# Patient Record
Sex: Male | Born: 1970 | Race: White | Hispanic: No | Marital: Married | Smoking: Never smoker
Health system: Southern US, Community
[De-identification: ages and names within clinical notes are randomized; demographics above are authoritative.]

## PROBLEM LIST (undated history)

## (undated) DIAGNOSIS — I10 Essential (primary) hypertension: Secondary | ICD-10-CM

## (undated) HISTORY — PX: NO PAST SURGERIES: SHX2092

## (undated) HISTORY — DX: Essential (primary) hypertension: I10

---

## 2003-01-22 ENCOUNTER — Emergency Department (HOSPITAL_COMMUNITY): Admission: EM | Admit: 2003-01-22 | Discharge: 2003-01-22 | Payer: Self-pay | Admitting: Emergency Medicine

## 2018-01-22 ENCOUNTER — Emergency Department (HOSPITAL_COMMUNITY): Payer: BLUE CROSS/BLUE SHIELD

## 2018-01-22 ENCOUNTER — Encounter (HOSPITAL_COMMUNITY): Payer: Self-pay | Admitting: Emergency Medicine

## 2018-01-22 ENCOUNTER — Emergency Department (HOSPITAL_COMMUNITY)
Admission: EM | Admit: 2018-01-22 | Discharge: 2018-01-22 | Disposition: A | Discharge status: Home / Self Care | Payer: BLUE CROSS/BLUE SHIELD | Attending: Emergency Medicine | Admitting: Emergency Medicine

## 2018-01-22 ENCOUNTER — Other Ambulatory Visit: Payer: Self-pay

## 2018-01-22 DIAGNOSIS — N200 Calculus of kidney: Secondary | ICD-10-CM

## 2018-01-22 DIAGNOSIS — N132 Hydronephrosis with renal and ureteral calculous obstruction: Secondary | ICD-10-CM | POA: Diagnosis not present

## 2018-01-22 DIAGNOSIS — R7989 Other specified abnormal findings of blood chemistry: Secondary | ICD-10-CM | POA: Insufficient documentation

## 2018-01-22 DIAGNOSIS — R739 Hyperglycemia, unspecified: Secondary | ICD-10-CM

## 2018-01-22 DIAGNOSIS — N201 Calculus of ureter: Secondary | ICD-10-CM

## 2018-01-22 DIAGNOSIS — R030 Elevated blood-pressure reading, without diagnosis of hypertension: Secondary | ICD-10-CM | POA: Diagnosis not present

## 2018-01-22 DIAGNOSIS — R109 Unspecified abdominal pain: Secondary | ICD-10-CM | POA: Diagnosis present

## 2018-01-22 LAB — URINALYSIS, ROUTINE W REFLEX MICROSCOPIC
Bilirubin Urine: NEGATIVE
GLUCOSE, UA: NEGATIVE mg/dL
Ketones, ur: NEGATIVE mg/dL
Leukocytes, UA: NEGATIVE
NITRITE: NEGATIVE
PROTEIN: NEGATIVE mg/dL
Specific Gravity, Urine: 1.013 (ref 1.005–1.030)
pH: 7 (ref 5.0–8.0)

## 2018-01-22 LAB — COMPREHENSIVE METABOLIC PANEL
ALT: 40 U/L (ref 0–44)
ANION GAP: 8 (ref 5–15)
AST: 30 U/L (ref 15–41)
Albumin: 3.9 g/dL (ref 3.5–5.0)
Alkaline Phosphatase: 65 U/L (ref 38–126)
BILIRUBIN TOTAL: 0.8 mg/dL (ref 0.3–1.2)
BUN: 20 mg/dL (ref 6–20)
CO2: 27 mmol/L (ref 22–32)
Calcium: 10.1 mg/dL (ref 8.9–10.3)
Chloride: 106 mmol/L (ref 98–111)
Creatinine, Ser: 1.28 mg/dL — ABNORMAL HIGH (ref 0.61–1.24)
GFR calc Af Amer: >60 mL/min (ref >60)
Glucose, Bld: 132 mg/dL — ABNORMAL HIGH (ref 70–99)
POTASSIUM: 3.8 mmol/L (ref 3.5–5.1)
Sodium: 141 mmol/L (ref 135–145)
TOTAL PROTEIN: 6.8 g/dL (ref 6.5–8.1)

## 2018-01-22 LAB — CBC
HEMATOCRIT: 47.2 % (ref 39.0–52.0)
HEMOGLOBIN: 15.5 g/dL (ref 13.0–17.0)
MCH: 30.1 pg (ref 26.0–34.0)
MCHC: 32.8 g/dL (ref 30.0–36.0)
MCV: 91.7 fL (ref 78.0–100.0)
Platelets: 265 10*3/uL (ref 150–400)
RBC: 5.15 MIL/uL (ref 4.22–5.81)
RDW: 12.8 % (ref 11.5–15.5)
WBC: 8.1 10*3/uL (ref 4.0–10.5)

## 2018-01-22 LAB — LIPASE, BLOOD: Lipase: 23 U/L (ref 11–51)

## 2018-01-22 MED ORDER — NAPROXEN 375 MG PO TABS: 375.0000 mg | ORAL_TABLET | Freq: Two times a day (BID) | ORAL | 0 refills | Status: DC

## 2018-01-22 MED ORDER — ONDANSETRON HCL 4 MG PO TABS: 4.0000 mg | ORAL_TABLET | Freq: Three times a day (TID) | ORAL | 0 refills | Status: DC | PRN

## 2018-01-22 MED ORDER — ONDANSETRON HCL 4 MG/2ML IJ SOLN
4.0000 mg | Freq: Once | INTRAMUSCULAR | Status: AC
  Administered 2018-01-22: 4 mg via INTRAVENOUS
  Filled 2018-01-22: qty 2

## 2018-01-22 MED ORDER — TAMSULOSIN HCL 0.4 MG PO CAPS: 0.4000 mg | ORAL_CAPSULE | Freq: Every day | ORAL | 0 refills | Status: DC

## 2018-01-22 MED ORDER — OXYCODONE-ACETAMINOPHEN 5-325 MG PO TABS: 1.0000 | ORAL_TABLET | Freq: Four times a day (QID) | ORAL | 0 refills | Status: DC | PRN

## 2018-01-22 MED ORDER — HYDROMORPHONE HCL 1 MG/ML IJ SOLN
0.5000 mg | Freq: Once | INTRAMUSCULAR | Status: AC
  Administered 2018-01-22: 0.5 mg via INTRAVENOUS
  Filled 2018-01-22: qty 1

## 2018-01-22 NOTE — ED Notes (Signed)
Patient transported to CT 

## 2018-01-22 NOTE — ED Triage Notes (Signed)
Pt reports rt sided flank pain that radiates to the RLQ that has been intermittent for the last 4 days. Pt reports nausea, denies V/D. Pt reports taking an antacid with some relief.

## 2018-01-22 NOTE — ED Provider Notes (Addendum)
[facility] EMERGENCY DEPARTMENT Provider Note   CSN: [id number] Arrival date & time: 01/22/18  4098     History   Chief Complaint Chief Complaint  Patient presents with  . Flank Pain  . Nausea    HPI Seth Underwood is a 47 y.o. male.  Presents emergency department chief complaint of flank pain.  Patient had sudden onset of very sharp severe right flank pain radiating into his abdomen about 3 days ago.  He has had several episodes of intermittent and severe pain with associated nausea without vomiting.  He states that yesterday they were going to come to the urgent care but by the time he arrived it resolved however when he returned home it became severe again.  His wife is at the bedside and states that he is unable to sit still or find a position of comfort and is squirming all over the place when it hits.  He has no previous history of kidney stones.  The pain does not radiate into his hips or legs.  He denies urinary symptoms such as frequency urgency hematuria or dysuria.  He denies fevers or chills.  He has no known contributing past medical history . HPI  History reviewed. No pertinent past medical history.  There are no active problems to display for this patient.   History reviewed. No pertinent surgical history.      Home Medications    Prior to Admission medications   Not on File    Family History No family history on file.  Social History Social History   Tobacco Use  . Smoking status: Never Smoker  . Smokeless tobacco: Never Used  Substance Use Topics  . Alcohol use: Yes    Comment: occ  . Drug use: Never     Allergies   Ceclor [cefaclor]   Review of Systems Review of Systems   Ten systems reviewed and are negative for acute change, except as noted in the HPI.    Physical Exam Updated Vital Signs BP (!) 135/95   Pulse 61   Temp 97.9 F (36.6 C) (Oral)   Resp 16   Ht 5\' 9"  (1.753 m)   Wt 97.5 kg   SpO2 100%   BMI  31.75 kg/m   Physical Exam  Constitutional: He appears well-developed and well-nourished. No distress.  Appears uncomfortable  HENT:  Head: Normocephalic and atraumatic.  Eyes: Conjunctivae are normal. No scleral icterus.  Neck: Normal range of motion. Neck supple.  Cardiovascular: Normal rate, regular rhythm and normal heart sounds.  Pulmonary/Chest: Effort normal and breath sounds normal. No respiratory distress.  Abdominal: Soft. He exhibits no distension and no mass. There is no tenderness. There is no guarding.  R cva tenderness  Musculoskeletal: He exhibits no edema.  Neurological: He is alert.  Skin: Skin is warm and dry. He is not diaphoretic.  Psychiatric: His behavior is normal.  Nursing note and vitals reviewed.    ED Treatments / Results  Labs (all labs ordered are listed, but only abnormal results are displayed) Labs Reviewed  COMPREHENSIVE METABOLIC PANEL - Abnormal; Notable for the following components:      Result Value   Glucose, Bld 132 (*)    Creatinine, Ser 1.28 (*)    All other components within normal limits  URINALYSIS, ROUTINE W REFLEX MICROSCOPIC - Abnormal; Notable for the following components:   APPearance CLOUDY (*)    Hgb urine dipstick SMALL (*)    Bacteria, UA RARE (*)  All other components within normal limits  LIPASE, BLOOD  CBC    EKG None  Radiology Ct Renal Stone Study  Result Date: 01/22/2018 CLINICAL DATA:  Right flank pain for 4 days. Nausea. EXAM: CT ABDOMEN AND PELVIS WITHOUT CONTRAST TECHNIQUE: Multidetector CT imaging of the abdomen and pelvis was performed following the standard protocol without IV contrast. COMPARISON:  None. FINDINGS: Lower chest: The visualized lung bases are clear. Hepatobiliary: Hepatic steatosis. No evidence gallstones, gallbladder wall thickening, or biliary dilatation. Pancreas: Unremarkable. Spleen: Unremarkable. Adrenals/Urinary Tract: Unremarkable adrenal glands. There are 4 right renal calculi  with the largest measuring 4 mm. There is a 3 mm calculus in the proximal right ureter resulting in mild right hydronephrosis. There is only minimal dilatation of the proximal right ureter with mild periureteral stranding noted. No left renal calculi are identified. Small rounded hypodense structures in the left renal hilum may represent parapelvic cysts. The bladder is unremarkable. Stomach/Bowel: The stomach is within normal limits. There is no evidence of bowel obstruction. Mild sigmoid colon diverticulosis is noted without evidence of diverticulitis. The appendix is unremarkable. Vascular/Lymphatic: Normal caliber of the abdominal aorta. No enlarged lymph nodes. Reproductive: Unremarkable prostate. Other: No intraperitoneal free fluid. No abdominal wall hernia. Musculoskeletal: No acute osseous abnormality or suspicious osseous lesion. Mild lower lumbar and thoracic disc degeneration. IMPRESSION: 1. Mild right hydronephrosis secondary to a 3 mm proximal right ureteral stone. 2. Additional small right renal calculi. 3. Hepatic steatosis. Electronically Signed   By: Sebastian Ache M.D.   On: 01/22/2018 08:53    Procedures Procedures (including critical care time)  Medications Ordered in ED Medications  ondansetron (ZOFRAN) injection 4 mg (4 mg Intravenous Given 01/22/18 0807)  HYDROmorphone (DILAUDID) injection 0.5 mg (0.5 mg Intravenous Given 01/22/18 0808)     Initial Impression / Assessment and Plan / ED Course  I have reviewed the triage vital signs and the nursing notes.  Pertinent labs & imaging results that were available during my care of the patient were reviewed by me and considered in my medical decision making (see chart for details).  Clinical Course as of Jan 23 919  Thu Jan 22, 2018  0919 Slight elevation of Creatinine.  Creatinine(!): 1.28 [AH]  0919 Patient CT scan shows right proximal ureteral calculus 3 mm.   [AH]    Clinical Course User Index [AH] Arthor Captain, PA-C     Pt has been diagnosed with a Kidney Stone via CT. There is no evidence of significant hydronephrosis, serum creatine slightly elevated, vitals sign stable and the pt does not have irratractable vomiting. Pt will be dc home with pain medications & has been advised to follow up with PCP.    Final Clinical Impressions(s) / ED Diagnoses   Final diagnoses:  Right ureteral stone  Elevated blood pressure reading  Elevated blood sugar level  Kidney stone  Elevated serum creatinine    ED Discharge Orders    None       Arthor Captain, PA-C 01/22/18 1647    Arthor Captain, PA-C 01/22/18 1648    Little, Ambrose [place], MD 01/23/18 1530

## 2018-01-22 NOTE — Discharge Instructions (Signed)
Return to the ED immediately if you develop fever, uncontrolled pain or vomiting, or other concerns. ° °

## 2018-01-22 NOTE — ED Notes (Signed)
Pt is complaining of right flank pain. States that pain comes and goes, with increased pain pt has nausea. Pt states that the pain originally started 3 days ago.

## 2019-03-25 IMAGING — CT CT RENAL STONE PROTOCOL
2 of 5 series · 16 of 46 positions shown, 18 images · non-contrast
Comparison: None.

CLINICAL DATA: Right flank pain for 4 days. Nausea.

EXAM:
CT ABDOMEN AND PELVIS WITHOUT CONTRAST
TECHNIQUE: Multidetector CT imaging of the abdomen and pelvis was performed
following the standard protocol without IV contrast.

[Series 3: renal stone 5.0 · axial · 0.90mm/px · z∈[+812,+1236]mm · 13 of 95 slices shown, 15 images]
[im 5/95  soft-tissue]
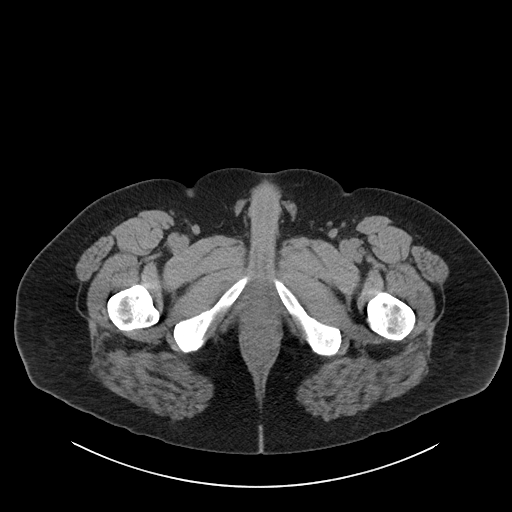
[im 5/95  bone]
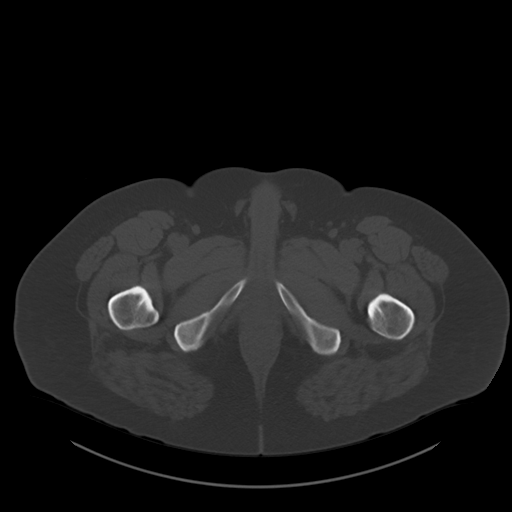
[im 14/95  soft-tissue]
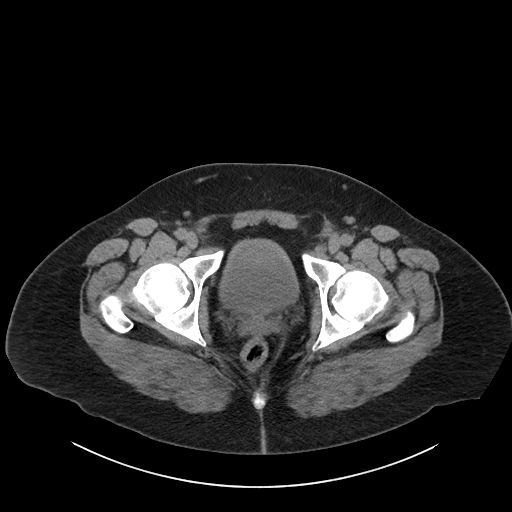
[im 18/95  soft-tissue]
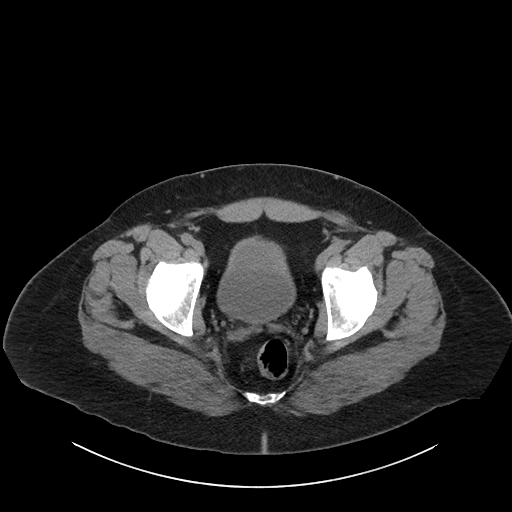
[im 27/95  soft-tissue]
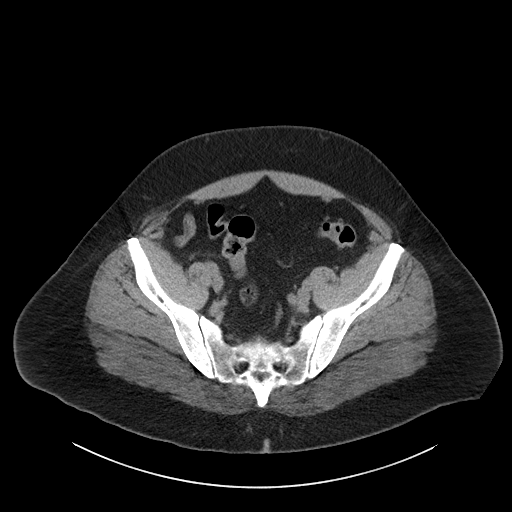
[im 32/95  soft-tissue]
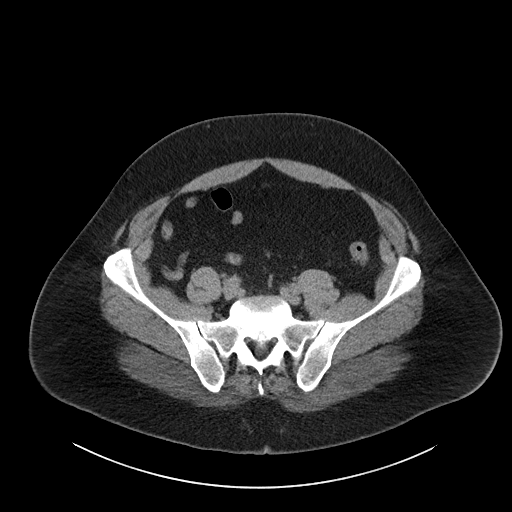
[im 41/95  soft-tissue]
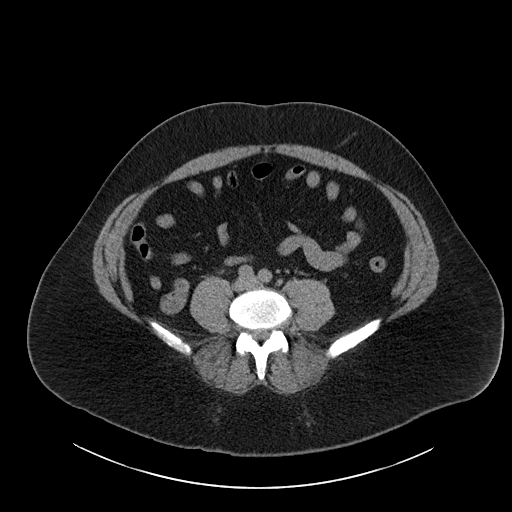
[im 50/95  soft-tissue]
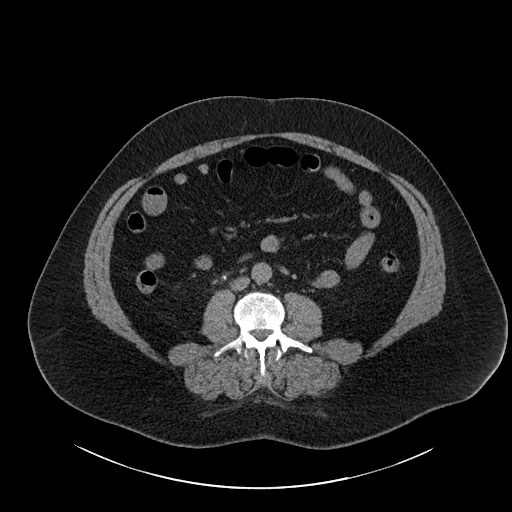
[im 54/95  soft-tissue]
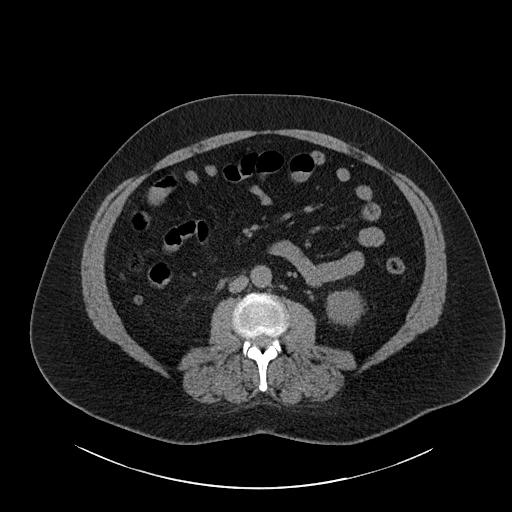
[im 63/95  soft-tissue]
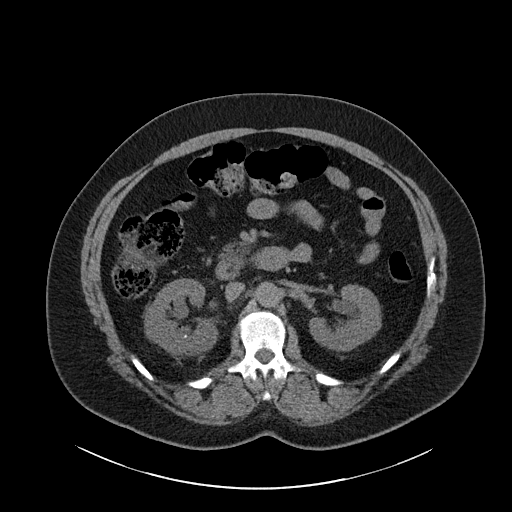
[im 63/95  bone]
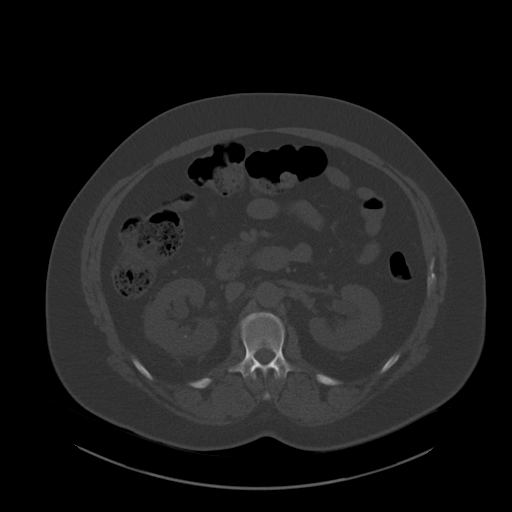
[im 68/95  soft-tissue]
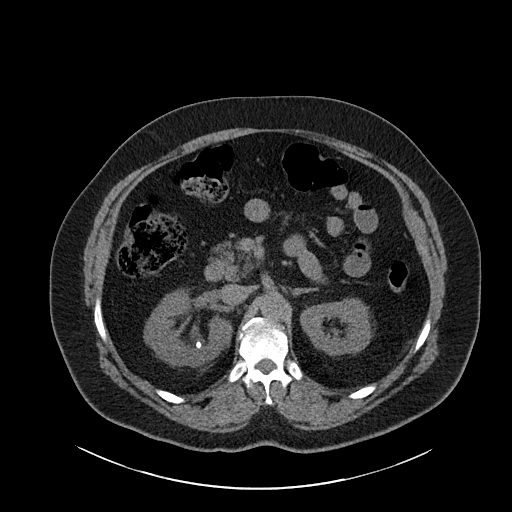
[im 77/95  soft-tissue]
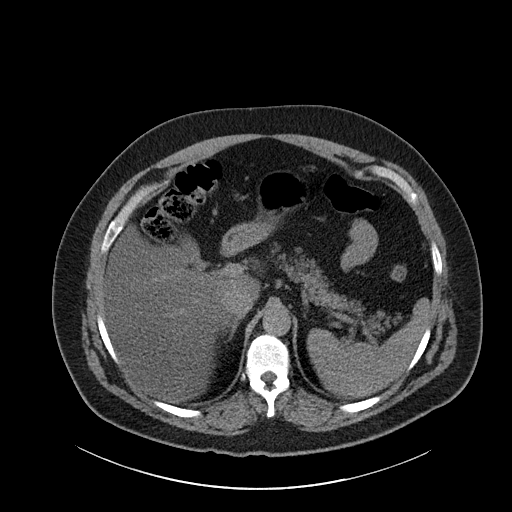
[im 81/95  soft-tissue]
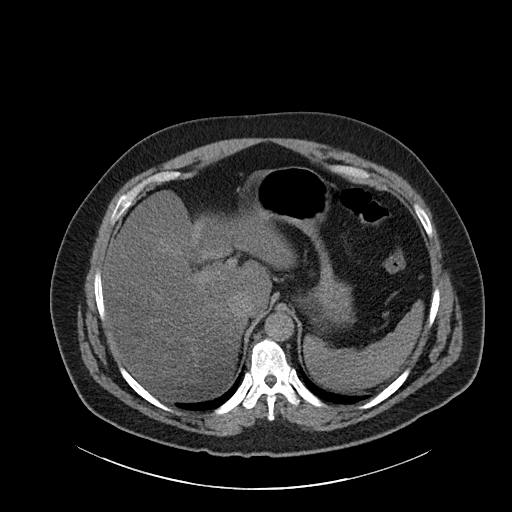
[im 90/95  soft-tissue]
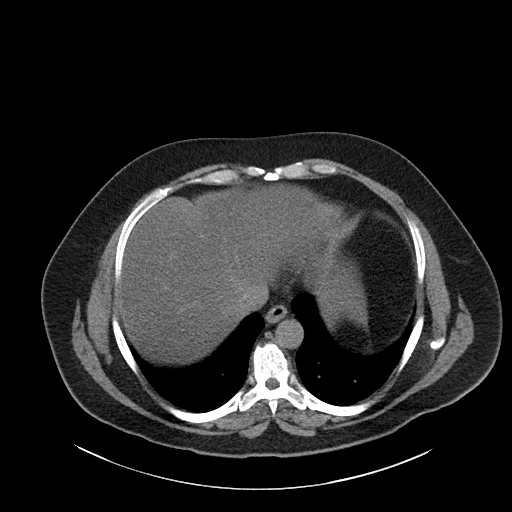

[Series 5: renal stone 3.0 cor · coronal · 0.82mm/px · 3 of 101 slices shown]
[im 34/101  soft-tissue]
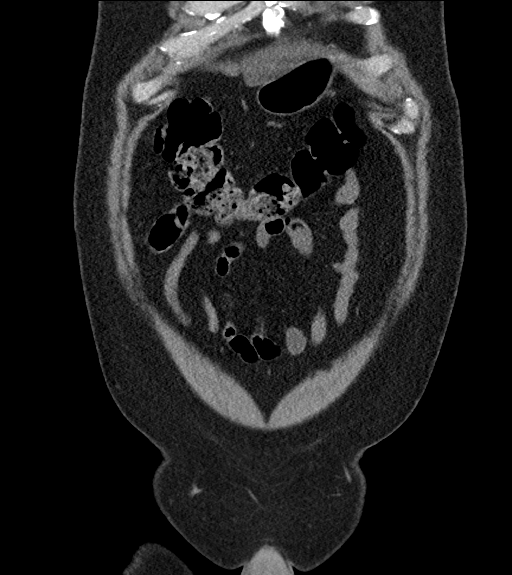
[im 45/101  soft-tissue]
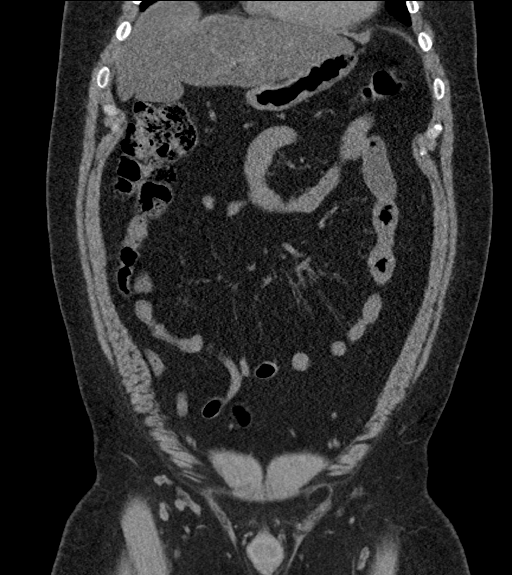
[im 56/101  soft-tissue]
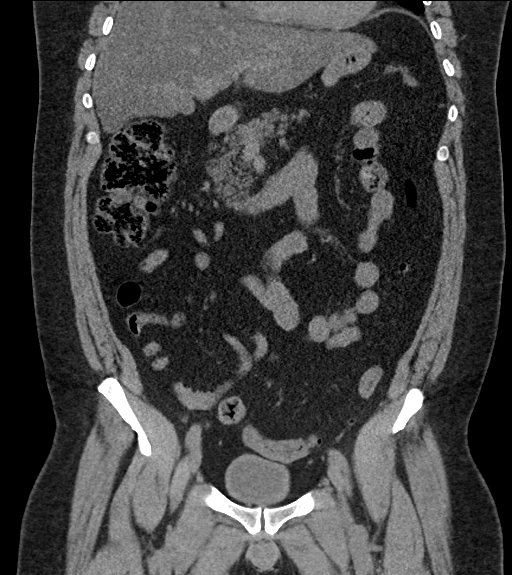

[16 of 46 positions shown; findings below may reference images not displayed]

FINDINGS: Lower chest: The visualized lung bases are clear.

Hepatobiliary: Hepatic steatosis. No evidence gallstones,
gallbladder wall thickening, or biliary dilatation.

Pancreas: Unremarkable.

Spleen: Unremarkable.

Adrenals/Urinary Tract: Unremarkable adrenal glands. There are 4
right renal calculi with the largest measuring 4 mm. There is a 3 mm
calculus in the proximal right ureter resulting in mild right
hydronephrosis. There is only minimal dilatation of the proximal
right ureter with mild periureteral stranding noted. No left renal
calculi are identified. Small rounded hypodense structures in the
left renal hilum may represent parapelvic cysts. The bladder is
unremarkable.

Stomach/Bowel: The stomach is within normal limits. There is no
evidence of bowel obstruction. Mild sigmoid colon diverticulosis is
noted without evidence of diverticulitis. The appendix is
unremarkable.

Vascular/Lymphatic: Normal caliber of the abdominal aorta. No
enlarged lymph nodes.

Reproductive: Unremarkable prostate.

Other: No intraperitoneal free fluid. No abdominal wall hernia.

Musculoskeletal: No acute osseous abnormality or suspicious osseous
lesion. Mild lower lumbar and thoracic disc degeneration.
IMPRESSION: 1. Mild right hydronephrosis secondary to a 3 mm proximal right
ureteral stone.
2. Additional small right renal calculi.
3. Hepatic steatosis.

## 2019-11-04 ENCOUNTER — Ambulatory Visit: Payer: BLUE CROSS/BLUE SHIELD | Admitting: Family Medicine

## 2020-05-22 ENCOUNTER — Ambulatory Visit (INDEPENDENT_AMBULATORY_CARE_PROVIDER_SITE_OTHER): Payer: 59 | Admitting: Family Medicine

## 2020-05-22 ENCOUNTER — Encounter: Payer: Self-pay | Admitting: Family Medicine

## 2020-05-22 ENCOUNTER — Other Ambulatory Visit: Payer: Self-pay

## 2020-05-22 VITALS — BP 130/90 | HR 105 | Ht 67.0 in | Wt 211.8 lb

## 2020-05-22 DIAGNOSIS — E1169 Type 2 diabetes mellitus with other specified complication: Secondary | ICD-10-CM | POA: Insufficient documentation

## 2020-05-22 DIAGNOSIS — R531 Weakness: Secondary | ICD-10-CM

## 2020-05-22 DIAGNOSIS — R350 Frequency of micturition: Secondary | ICD-10-CM | POA: Diagnosis not present

## 2020-05-22 DIAGNOSIS — R634 Abnormal weight loss: Secondary | ICD-10-CM

## 2020-05-22 DIAGNOSIS — E669 Obesity, unspecified: Secondary | ICD-10-CM | POA: Insufficient documentation

## 2020-05-22 DIAGNOSIS — E119 Type 2 diabetes mellitus without complications: Secondary | ICD-10-CM

## 2020-05-22 DIAGNOSIS — R631 Polydipsia: Secondary | ICD-10-CM | POA: Diagnosis not present

## 2020-05-22 DIAGNOSIS — R81 Glycosuria: Secondary | ICD-10-CM | POA: Diagnosis not present

## 2020-05-22 DIAGNOSIS — R824 Acetonuria: Secondary | ICD-10-CM

## 2020-05-22 LAB — CBC WITH DIFFERENTIAL/PLATELET
Basophils Absolute: 0.1 10*3/uL (ref 0.0–0.2)
Basos: 1 %
Eos: 3 %
Immature Grans (Abs): 0 10*3/uL (ref 0.0–0.1)
Lymphocytes Absolute: 2.9 10*3/uL (ref 0.7–3.1)
Neutrophils Absolute: 3.9 10*3/uL (ref 1.4–7.0)
Platelets: 263 10*3/uL (ref 150–450)
RDW: 12.4 % (ref 11.6–15.4)

## 2020-05-22 LAB — POCT URINALYSIS DIP (PROADVANTAGE DEVICE)
Bilirubin, UA: NEGATIVE
Blood, UA: NEGATIVE
Glucose, UA: >=1000 mg/dL — AB
Leukocytes, UA: NEGATIVE
Nitrite, UA: NEGATIVE
Protein Ur, POC: NEGATIVE mg/dL
Specific Gravity, Urine: 1.025
Urobilinogen, Ur: NEGATIVE
pH, UA: 5.5 (ref 5.0–8.0)

## 2020-05-22 LAB — COMPREHENSIVE METABOLIC PANEL

## 2020-05-22 LAB — POCT GLYCOSYLATED HEMOGLOBIN (HGB A1C): Hemoglobin A1C: 14 % — AB (ref 4.0–5.6)

## 2020-05-22 LAB — LIPID PANEL

## 2020-05-22 LAB — GLUCOSE, POCT (MANUAL RESULT ENTRY): POC Glucose: 308 mg/dl — AB (ref 70–99)

## 2020-05-22 MED ORDER — ONETOUCH VERIO W/DEVICE KIT: PACK | 0 refills | Status: DC

## 2020-05-22 MED ORDER — DAPAGLIFLOZIN PROPANEDIOL 5 MG PO TABS: 5.0000 mg | ORAL_TABLET | Freq: Every day | ORAL | 2 refills | Status: DC

## 2020-05-22 MED ORDER — METFORMIN HCL 1000 MG PO TABS: 1000.0000 mg | ORAL_TABLET | Freq: Two times a day (BID) | ORAL | 1 refills | Status: DC

## 2020-05-22 MED ORDER — ATORVASTATIN CALCIUM 10 MG PO TABS: 10.0000 mg | ORAL_TABLET | Freq: Every day | ORAL | 1 refills | Status: DC

## 2020-05-22 MED ORDER — LANTUS SOLOSTAR 100 UNIT/ML ~~LOC~~ SOPN: 10.0000 [IU] | PEN_INJECTOR | Freq: Every day | SUBCUTANEOUS | 2 refills | Status: DC

## 2020-05-22 MED ORDER — GLUCOSE BLOOD VI STRP: ORAL_STRIP | 1 refills | Status: DC

## 2020-05-22 MED ORDER — ONETOUCH DELICA LANCETS 33G MISC: 1 refills | Status: DC

## 2020-05-22 NOTE — Patient Instructions (Addendum)
Your hemoglobin A1c is greater than 14% today and you have diabetes that is currently uncontrolled.  Is important that you continue eating a low-carb diet.  Try to limit carbohydrates to 45-60 per meal and around 15 per snack. Try to walk 10 minutes at a time 2-3 times per day.  Start checking your blood sugars as discussed.  Fasting goal blood sugar is 80-130.  2 hours after a meal the goal is 130-160.  It will take some time to get your blood sugars down.  We do not want to drop them very fast.  I am starting you on to oral medications today.  Metformin and Farxiga. I am also starting you on a daily long-acting insulin.  This should be 10 units/day.  You should receive a call from the diabetes nutritionist.   Call and schedule your diabetes eye exam.   Follow up with me in 1 week.     Diabetes Mellitus and Nutrition, Adult When you have diabetes, or diabetes mellitus, it is very important to have healthy eating habits because your blood sugar (glucose) levels are greatly affected by what you eat and drink. Eating healthy foods in the right amounts, at about the same times every day, can help you:  Control your blood glucose.  Lower your risk of heart disease.  Improve your blood pressure.  Reach or maintain a healthy weight. What can affect my meal plan? Every person with diabetes is different, and each person has different needs for a meal plan. Your health care provider may recommend that you work with a dietitian to make a meal plan that is best for you. Your meal plan may vary depending on factors such as:  The calories you need.  The medicines you take.  Your weight.  Your blood glucose, blood pressure, and cholesterol levels.  Your activity level.  Other health conditions you have, such as heart or kidney disease. How do carbohydrates affect me? Carbohydrates, also called carbs, affect your blood glucose level more than any other type of food. Eating carbs  naturally raises the amount of glucose in your blood. Carb counting is a method for keeping track of how many carbs you eat. Counting carbs is important to keep your blood glucose at a healthy level, especially if you use insulin or take certain oral diabetes medicines. It is important to know how many carbs you can safely have in each meal. This is different for every person. Your dietitian can help you calculate how many carbs you should have at each meal and for each snack. How does alcohol affect me? Alcohol can cause a sudden decrease in blood glucose (hypoglycemia), especially if you use insulin or take certain oral diabetes medicines. Hypoglycemia can be a life-threatening condition. Symptoms of hypoglycemia, such as sleepiness, dizziness, and confusion, are similar to symptoms of having too much alcohol.  Do not drink alcohol if: ? Your health care provider tells you not to drink. ? You are pregnant, may be pregnant, or are planning to become pregnant.  If you drink alcohol: ? Do not drink on an empty stomach. ? Limit how much you use to:  0-1 drink a day for women.  0-2 drinks a day for men. ? Be aware of how much alcohol is in your drink. In the [place] one drink equals one 12 oz bottle of beer (355 mL), one 5 oz glass of wine (148 mL), or one 1 oz glass of hard liquor (44 mL). ? Keep yourself  hydrated with water, diet soda, or unsweetened iced tea.  Keep in mind that regular soda, juice, and other mixers may contain a lot of sugar and must be counted as carbs. What are tips for following this plan? Reading food labels  Start by checking the serving size on the "Nutrition Facts" label of packaged foods and drinks. The amount of calories, carbs, fats, and other nutrients listed on the label is based on one serving of the item. Many items contain more than one serving per package.  Check the total grams (g) of carbs in one serving. You can calculate the number of servings of carbs in  one serving by dividing the total carbs by 15. For example, if a food has 30 g of total carbs per serving, it would be equal to 2 servings of carbs.  Check the number of grams (g) of saturated fats and trans fats in one serving. Choose foods that have a low amount or none of these fats.  Check the number of milligrams (mg) of salt (sodium) in one serving. Most people should limit total sodium intake to less than 2,300 mg per day.  Always check the nutrition information of foods labeled as "low-fat" or "nonfat." These foods may be higher in added sugar or refined carbs and should be avoided.  Talk to your dietitian to identify your daily goals for nutrients listed on the label. Shopping  Avoid buying canned, pre-made, or processed foods. These foods tend to be high in fat, sodium, and added sugar.  Shop around the outside edge of the grocery store. This is where you will most often find fresh fruits and vegetables, bulk grains, fresh meats, and fresh dairy. Cooking  Use low-heat cooking methods, such as baking, instead of high-heat cooking methods like deep frying.  Cook using healthy oils, such as olive, canola, or sunflower oil.  Avoid cooking with butter, cream, or high-fat meats. Meal planning  Eat meals and snacks regularly, preferably at the same times every day. Avoid going long periods of time without eating.  Eat foods that are high in fiber, such as fresh fruits, vegetables, beans, and whole grains. Talk with your dietitian about how many servings of carbs you can eat at each meal.  Eat 4-6 oz (112-168 g) of lean protein each day, such as lean meat, chicken, fish, eggs, or tofu. One ounce (oz) of lean protein is equal to: ? 1 oz (28 g) of meat, chicken, or fish. ? 1 egg. ?  cup (62 g) of tofu.  Eat some foods each day that contain healthy fats, such as avocado, nuts, seeds, and fish.   What foods should I eat? Fruits Berries. Apples. Oranges. Peaches. Apricots. Plums.  Grapes. Mango. Papaya. Pomegranate. Kiwi. Cherries. Vegetables Lettuce. Spinach. Leafy greens, including kale, chard, collard greens, and mustard greens. Beets. Cauliflower. Cabbage. Broccoli. Carrots. Green beans. Tomatoes. Peppers. Onions. Cucumbers. Brussels sprouts. Grains Whole grains, such as whole-wheat or whole-grain bread, crackers, tortillas, cereal, and pasta. Unsweetened oatmeal. Quinoa. Brown or wild rice. Meats and other proteins Seafood. Poultry without skin. Lean cuts of poultry and beef. Tofu. Nuts. Seeds. Dairy Low-fat or fat-free dairy products such as milk, yogurt, and cheese. The items listed above may not be a complete list of foods and beverages you can eat. Contact a dietitian for more information. What foods should I avoid? Fruits Fruits canned with syrup. Vegetables Canned vegetables. Frozen vegetables with butter or cream sauce. Grains Refined white flour and flour products such as  bread, pasta, snack foods, and cereals. Avoid all processed foods. Meats and other proteins Fatty cuts of meat. Poultry with skin. Breaded or fried meats. Processed meat. Avoid saturated fats. Dairy Full-fat yogurt, cheese, or milk. Beverages Sweetened drinks, such as soda or iced tea. The items listed above may not be a complete list of foods and beverages you should avoid. Contact a dietitian for more information. Questions to ask a health care provider  Do I need to meet with a diabetes educator?  Do I need to meet with a dietitian?  What number can I call if I have questions?  When are the best times to check my blood glucose? Where to find more information:  American Diabetes Association: diabetes.org  Academy of Nutrition and Dietetics: www.eatright.[employer] of Diabetes and Digestive and Kidney Diseases: CarFlippers.tn  Association of Diabetes Care and Education Specialists: www.diabeteseducator.org Summary  It is important to have healthy  eating habits because your blood sugar (glucose) levels are greatly affected by what you eat and drink.  A healthy meal plan will help you control your blood glucose and maintain a healthy lifestyle.  Your health care provider may recommend that you work with a dietitian to make a meal plan that is best for you.  Keep in mind that carbohydrates (carbs) and alcohol have immediate effects on your blood glucose levels. It is important to count carbs and to use alcohol carefully. This information is not intended to replace advice given to you by your health care provider. Make sure you discuss any questions you have with your health care provider. Document Revised: 03/16/2019 Document Reviewed: 03/16/2019 Elsevier Patient Education  2021 [employer].

## 2020-05-22 NOTE — Progress Notes (Signed)
Subjective:    Patient ID: Seth Underwood, male    DOB: 07/16/1970, 50 y.o.   MRN: [id number]  HPI Chief Complaint  Patient presents with  . Urinary Frequency    Urinary frequency for the last several weeks. Up every hour, thirsty all the time,weakness, no history of diabetes,    He is new to the practice and here to establish care.  His wife is with him today. No regular medical care in 13 years or so.   Complains of one week history of dry mouth, urinary frequency, polydipsia, polyuria, weakness, and unexplained weight loss.  Denies history of diabetes. States he and his wife thought he may have developed diabetes over the past couple of weeks he has cut back on sugar and carbohydrates.  He stopped soda.  Reports a history of intermittent leg cramps for the past year or so.   Denies fever, chills, body aches, chest pain, palpitations, DOE, orthopnea, abdominal pain, vomiting, diarrhea or LE edema.   Social history: married, 3 kids, he reupholsterers furniture Denies smoking or drug use. Occasional alcohol.    Reviewed allergies, medications, past medical, surgical, family, and social history.    Review of Systems Pertinent positives and negatives in the history of present illness.     Objective:   Physical Exam BP 130/90   Pulse (!) 105   Ht 5\' 7"  (1.702 m)   Wt 211 lb 12.8 oz (96.1 kg)   BMI 33.17 kg/m   Alert and in no distress. Cardiac exam shows a regular sinus rhythm without murmurs or gallops. Lungs are clear to auscultation.  Extremities without edema.  Skin is warm and dry.        Assessment & Plan:  New onset type 2 diabetes mellitus (HCC) - Plan: CBC with Differential/Platelet, Comprehensive metabolic panel, TSH, T4, free, T3, EKG 12-Lead, Lipid panel, metFORMIN (GLUCOPHAGE) 1000 MG tablet, dapagliflozin propanediol (FARXIGA) 5 MG TABS tablet, insulin glargine (LANTUS SOLOSTAR) 100 UNIT/ML Solostar Pen, atorvastatin (LIPITOR) 10 MG tablet, Amb ref to  Medical Nutrition Therapy-MNT  Frequent urination - Plan: HgB A1c, Glucose (CBG), POCT Urinalysis DIP (Proadvantage Device), Microalbumin/Creatinine Ratio, Urine  Always thirsty - Plan: HgB A1c, Glucose (CBG), POCT Urinalysis DIP (Proadvantage Device), Microalbumin/Creatinine Ratio, Urine  Glucose found in urine on examination - Plan: HgB A1c, Glucose (CBG), POCT Urinalysis DIP (Proadvantage Device), Microalbumin/Creatinine Ratio, Urine  Unexplained weight loss - Plan: CBC with Differential/Platelet, Comprehensive metabolic panel, TSH, T4, free, T3  Weakness - Plan: CBC with Differential/Platelet, Comprehensive metabolic panel, TSH, T4, free, T3  Obesity (BMI 30-39.9) - Plan: Amb ref to Medical Nutrition Therapy-MNT  Ketonuria  Hemoglobin A1c greater than 14% EKG shows NSR, rate 98, PR interval 152 ms, QRS duration 86 ms. Poor R wave progression. Some early repolarization per Dr. .  PCO T blood sugar 308.  3 hours postprandial Urinalysis dipstick positive for glucose and ketones He is quite symptomatic.  New onset diabetes.  His wife is with him.  She is very familiar with diabetes In-depth counseling on the diabetes spectrum including long-term health consequences associated with uncontrolled diabetes including heart disease, amputations, blindness, kidney disease.  Discussed standards of care for patients with diabetes including an ACE inhibitor or ARB, statin, and diabetic eye exam.  He will call and schedule an eye exam. I will start him on Metformin, Farxiga and Lantus. He will start checking his blood sugars fasting and 2 hours postprandial, twice daily.  He will keep a record of these  and bring them to his follow-up visit. Referral to MNT for further assistance with diet He appears to be motivated to lower his blood sugars with healthy diet and exercise  I am also starting him on a statin.  I will hold off on an ACE inhibitor or ARB until his follow-up in 1 week.  Check  labs and follow-up

## 2020-05-23 ENCOUNTER — Encounter: Payer: Self-pay | Admitting: Family Medicine

## 2020-05-23 DIAGNOSIS — R7401 Elevation of levels of liver transaminase levels: Secondary | ICD-10-CM

## 2020-05-23 DIAGNOSIS — E785 Hyperlipidemia, unspecified: Secondary | ICD-10-CM

## 2020-05-23 DIAGNOSIS — E1169 Type 2 diabetes mellitus with other specified complication: Secondary | ICD-10-CM

## 2020-05-23 HISTORY — DX: Elevation of levels of liver transaminase levels: R74.01

## 2020-05-23 HISTORY — DX: Type 2 diabetes mellitus with other specified complication: E11.69

## 2020-05-23 HISTORY — DX: Hypercalcemia: E83.52

## 2020-05-23 HISTORY — DX: Hyperlipidemia, unspecified: E78.5

## 2020-05-23 LAB — COMPREHENSIVE METABOLIC PANEL
ALT: 57 IU/L — ABNORMAL HIGH (ref 0–44)
AST: 35 IU/L (ref 0–40)
Albumin/Globulin Ratio: 1.7 (ref 1.2–2.2)
Alkaline Phosphatase: 98 IU/L (ref 44–121)
Bilirubin Total: 0.4 mg/dL (ref 0.0–1.2)
Chloride: 99 mmol/L (ref 96–106)
Creatinine, Ser: 0.86 mg/dL (ref 0.76–1.27)
GFR calc Af Amer: 117 mL/min/{1.73_m2} (ref >59)
Globulin, Total: 2.6 g/dL (ref 1.5–4.5)
Glucose: 307 mg/dL — ABNORMAL HIGH (ref 65–99)
Sodium: 136 mmol/L (ref 134–144)
Total Protein: 7.1 g/dL (ref 6.0–8.5)

## 2020-05-23 LAB — LIPID PANEL
Chol/HDL Ratio: 7.1 ratio — ABNORMAL HIGH (ref 0.0–5.0)
Cholesterol, Total: 291 mg/dL — ABNORMAL HIGH (ref 100–199)
HDL: 41 mg/dL (ref >39)
Triglycerides: 413 mg/dL — ABNORMAL HIGH (ref 0–149)
VLDL Cholesterol Cal: 81 mg/dL — ABNORMAL HIGH (ref 5–40)

## 2020-05-23 LAB — CBC WITH DIFFERENTIAL/PLATELET
EOS (ABSOLUTE): 0.2 10*3/uL (ref 0.0–0.4)
Hematocrit: 53 % — ABNORMAL HIGH (ref 37.5–51.0)
Hemoglobin: 18.1 g/dL — ABNORMAL HIGH (ref 13.0–17.7)
Immature Granulocytes: 0 %
Lymphs: 38 %
MCH: 29.5 pg (ref 26.6–33.0)
MCHC: 34.2 g/dL (ref 31.5–35.7)
MCV: 87 fL (ref 79–97)
Monocytes Absolute: 0.5 10*3/uL (ref 0.1–0.9)
Monocytes: 7 %
Neutrophils: 51 %
RBC: 6.13 x10E6/uL — ABNORMAL HIGH (ref 4.14–5.80)
WBC: 7.6 10*3/uL (ref 3.4–10.8)

## 2020-05-23 LAB — MICROALBUMIN / CREATININE URINE RATIO
Creatinine, Urine: 62 mg/dL
Microalb/Creat Ratio: 18 mg/g creat (ref 0–29)
Microalbumin, Urine: 11 ug/mL

## 2020-05-23 LAB — T3: T3, Total: 102 ng/dL (ref 71–180)

## 2020-05-23 LAB — TSH: TSH: 2.8 u[IU]/mL (ref 0.450–4.500)

## 2020-05-23 LAB — T4, FREE: Free T4: 1.12 ng/dL (ref 0.82–1.77)

## 2020-05-25 ENCOUNTER — Encounter: Payer: Self-pay | Admitting: Family Medicine

## 2020-05-31 LAB — HM DIABETES EYE EXAM

## 2020-06-01 ENCOUNTER — Encounter: Payer: Self-pay | Admitting: Family Medicine

## 2020-06-01 ENCOUNTER — Ambulatory Visit (INDEPENDENT_AMBULATORY_CARE_PROVIDER_SITE_OTHER): Payer: 59 | Admitting: Family Medicine

## 2020-06-01 ENCOUNTER — Other Ambulatory Visit: Payer: Self-pay

## 2020-06-01 VITALS — BP 128/90 | HR 68 | Temp 98.3°F | Resp 16 | Wt 212.6 lb

## 2020-06-01 DIAGNOSIS — E119 Type 2 diabetes mellitus without complications: Secondary | ICD-10-CM | POA: Diagnosis not present

## 2020-06-01 DIAGNOSIS — R7401 Elevation of levels of liver transaminase levels: Secondary | ICD-10-CM | POA: Diagnosis not present

## 2020-06-01 DIAGNOSIS — E785 Hyperlipidemia, unspecified: Secondary | ICD-10-CM

## 2020-06-01 DIAGNOSIS — E1169 Type 2 diabetes mellitus with other specified complication: Secondary | ICD-10-CM | POA: Diagnosis not present

## 2020-06-01 NOTE — Progress Notes (Signed)
   Subjective:    Patient ID: Seth Underwood, male    DOB: [birth year], 50 y.o.   MRN: [id number]  HPI Chief Complaint  Patient presents with  . other    F/u on diabetes changes in vision but went to eye doctor doing better now.   He is here for a 1 week follow-up on new diagnosis of diabetes.  His hemoglobin A1c was greater than 14% and he was toxic.  Reports having significant side effects the first 2 days of starting his diabetes medications but symptoms have subsided and he is doing okay with his medications.  He is checking his blood sugars regularly.  His blood sugars are responding well.  His fasting blood sugar has been less than 130 over the past couple of days.  His 2-hour postprandial blood sugar yesterday was 102.  He held his insulin as a result. Reports making healthy changes to his diet and cutting back on sugar and carbohydrates.  He is eating a lot of salads. Urinary frequency has resolved.  No longer having significant thirst. No longer having blurred vision.   States he has been to his eye doctor since our last visit.  Upcoming visit with the nutritionist.   He and his wife report doing well and no new questions today.  No fever, chills, dizziness, headache, chest pain, palpitations, shortness of breath, nausea, vomiting or diarrhea.    Review of Systems Pertinent positives and negatives in the history of present illness.     Objective:   Physical Exam BP 128/90   Pulse 68   Temp 98.3 F (36.8 C)   Resp 16   Wt 212 lb 9.6 oz (96.4 kg)   BMI 33.30 kg/m    Alert and oriented and in no acute distress.  Respirations unlabored.     Assessment & Plan:  New onset type 2 diabetes mellitus (HCC)  Hyperlipidemia associated with type 2 diabetes mellitus (HCC)  Hypercalcemia  Elevated ALT measurement  Reviewed lab results with patient and his wife.  This is a 1 week follow-up to see how he is doing with new diagnosis of diabetes.  His blood sugars have  responded well to medications and eating a healthier diet.  He has made such significant improvement in his diet but his blood sugars now suggest that we should reduce his medications.  He also has an upcoming appointment with his nutritionist. Stop Lantus and continue Metformin and Farxiga.  He is taking a statin daily and we will check fasting lipids at his follow-up Follow up in 4 weeks fasting.  We will need to recheck most labs due to elevated calcium and thrombocytosis

## 2020-06-08 ENCOUNTER — Encounter: Payer: 59 | Attending: Family Medicine | Admitting: Dietician

## 2020-06-08 ENCOUNTER — Encounter: Payer: Self-pay | Admitting: Dietician

## 2020-06-08 ENCOUNTER — Other Ambulatory Visit: Payer: Self-pay

## 2020-06-08 DIAGNOSIS — E119 Type 2 diabetes mellitus without complications: Secondary | ICD-10-CM | POA: Insufficient documentation

## 2020-06-08 NOTE — Progress Notes (Signed)
Diabetes Self-Management Education  Visit Type: First/Initial  Appt. Start Time: 1700 Appt. End Time: 1830  06/08/2020  Mr. Seth Underwood, identified by name and date of birth, is a 50 y.o. male with a diagnosis of Diabetes: Type 2.   ASSESSMENT  Pt wife Eunice Blase is present for the appointment.  Pt states they do not want to be a diabetic forever, and has no idea what to eat. Pt wife reports a bout of sickness for 3 months with various symptoms of hyperglycemia (polyuria, polydipsia, extreme lethargy) Wife checked BG and found it to be 600. Pt reports previous dietary pattern of no breakfast, sugary snacks and then a dinner. Pt reports starting Metformin, Farxiga, and Trulicity upon diagnosis.  Pt has been able to discontinue Trulicity because their blood sugar has improved tremendously. Pt reports tremendous resolve towards getting blood sugar under control. Pt wife reports being a diabetic of 8 years, and recently had a stroke that woke her up. Wife states that they want to get their blood sugar under control together with pt. Pt reports having a daughter that is a nutrition major at [employer]. States daughter has been helping with nutrition advice.  Height 5\' 8"  (1.727 m), weight 211 lb 9.6 oz (96 kg). Body mass index is 32.17 kg/m.   Diabetes Self-Management Education - 06/08/20 1719      Visit Information   Visit Type First/Initial      Initial Visit   Diabetes Type Type 2    Are you currently following a meal plan? No    Are you taking your medications as prescribed? Yes    Date Diagnosed 05/22/2020      Health Coping   How would you rate your overall health? Fair      Psychosocial Assessment   Patient Belief/Attitude about Diabetes Motivated to manage diabetes    Self-care barriers Low literacy    Other persons present Patient;Spouse/SO    Patient Concerns Nutrition/Meal planning    Special Needs Verbal instruction    Preferred Learning Style Auditory;Hands on    Learning  Readiness Ready    How often do you need to have someone help you when you read instructions, pamphlets, or other written materials from your doctor or pharmacy? 5 - Always    What is the last grade level you completed in school? 12th      Pre-Education Assessment   Patient understands the diabetes disease and treatment process. Needs Instruction    Patient understands incorporating nutritional management into lifestyle. Needs Instruction    Patient undertands incorporating physical activity into lifestyle. Needs Instruction    Patient understands using medications safely. Needs Instruction    Patient understands monitoring blood glucose, interpreting and using results Needs Instruction    Patient understands prevention, detection, and treatment of acute complications. Needs Instruction    Patient understands prevention, detection, and treatment of chronic complications. Needs Instruction    Patient understands how to develop strategies to address psychosocial issues. Needs Instruction    Patient understands how to develop strategies to promote health/change behavior. Needs Instruction      Complications   Last HgB A1C per patient/outside source 14 %   05/22/2020   How often do you check your blood sugar? 1-2 times/day    Fasting Blood glucose range (mg/dL) 05/24/2020    Postprandial Blood glucose range (mg/dL) 01-749    Number of hypoglycemic episodes per month 0    Number of hyperglycemic episodes per week 0    Have  you had a dilated eye exam in the past 12 months? Yes    Have you had a dental exam in the past 12 months? No    Are you checking your feet? No      Dietary Intake   Breakfast 2 eggs, piece of bacon, glass of milk    Snack (morning) water, nuts    Lunch none    Snack (afternoon) water, nuts    Dinner grilled chicken tenderloins, 2 cups of sauteed vegetables    Snack (evening) none    Beverage(s) water, 4-5 bottles      Exercise   Exercise Type ADL's    How many days per  week to you exercise? 0    How many minutes per day do you exercise? 0    Total minutes per week of exercise 0      Patient Education   Disease state  Definition of diabetes, type 1 and 2, and the diagnosis of diabetes;Factors that contribute to the development of diabetes;Explored patient's options for treatment of their diabetes    Nutrition management  Role of diet in the treatment of diabetes and the relationship between the three main macronutrients and blood glucose level;Food label reading, portion sizes and measuring food.;Carbohydrate counting;Effects of alcohol on blood glucose and safety factors with consumption of alcohol.    Physical activity and exercise  Role of exercise on diabetes management, blood pressure control and cardiac health.    Medications Reviewed patients medication for diabetes, action, purpose, timing of dose and side effects.    Monitoring Taught/evaluated SMBG meter.;Purpose and frequency of SMBG.    Acute complications Discussed and identified patients' treatment of hyperglycemia.    Chronic complications Retinopathy and reason for yearly dilated eye exams;Nephropathy, what it is, prevention of, the use of ACE, ARB's and early detection of through urine microalbumia.    Psychosocial adjustment Role of stress on diabetes;Identified and addressed patients feelings and concerns about diabetes      Individualized Goals (developed by patient)   Nutrition Follow meal plan discussed;General guidelines for healthy choices and portions discussed    Physical Activity Not Applicable    Medications take my medication as prescribed    Monitoring  test my blood glucose as discussed      Post-Education Assessment   Patient understands the diabetes disease and treatment process. Needs Review    Patient understands incorporating nutritional management into lifestyle. Needs Review    Patient undertands incorporating physical activity into lifestyle. Needs Review    Patient  understands using medications safely. Needs Review    Patient understands monitoring blood glucose, interpreting and using results Needs Review    Patient understands prevention, detection, and treatment of acute complications. Needs Review    Patient understands prevention, detection, and treatment of chronic complications. Needs Review    Patient understands how to develop strategies to address psychosocial issues. Needs Review    Patient understands how to develop strategies to promote health/change behavior. Needs Review      Outcomes   Expected Outcomes Demonstrated interest in learning. Expect positive outcomes    Future DMSE 4-6 wks    Program Status Not Completed           Individualized Plan for Diabetes Self-Management Training:   Learning Objective:  Patient will have a greater understanding of diabetes self-management. Patient education plan is to attend individual and/or group sessions per assessed needs and concerns.   Plan:   Patient Instructions  Take your metformin  on a full stomach AFTER a meal!  Continue checking your blood sugar every morning, and 2 hours after a meal.  Work towards eating three meals a day, about 5-6 hours apart!  Begin to recognize carbohydrates in your food choices!  Have 3 carb choices at each meal (45 g).   Begin to build your meals using the proportions of the Balanced Plate. . First, select your carb choice(s) for the meal, and determine how much you should have to equal 3 carb choices (45 g). . Next, select your source of protein to pair with your carb choice(s). . Finally, complete the remaining half of your meal with a variety of non-starchy vegetables.    Expected Outcomes:  Demonstrated interest in learning. Expect positive outcomes  Education material provided: ADA - How to Thrive: A Guide for Your Journey with Diabetes, Meal plan card, My Plate and Snack sheet  If problems or questions, patient to contact team via:  Phone  and Email  Future DSME appointment: 4-6 wks

## 2020-06-08 NOTE — Patient Instructions (Addendum)
Take your metformin on a full stomach AFTER a meal!  Continue checking your blood sugar every morning, and 2 hours after a meal.  Work towards eating three meals a day, about 5-6 hours apart!  Begin to recognize carbohydrates in your food choices!  Have 3 carb choices at each meal (45 g).   Begin to build your meals using the proportions of the Balanced Plate. . First, select your carb choice(s) for the meal, and determine how much you should have to equal 3 carb choices (45 g). . Next, select your source of protein to pair with your carb choice(s). . Finally, complete the remaining half of your meal with a variety of non-starchy vegetables.

## 2020-07-04 ENCOUNTER — Ambulatory Visit: Payer: 59 | Admitting: Family Medicine

## 2020-07-11 ENCOUNTER — Ambulatory Visit: Payer: 59 | Admitting: Dietician

## 2020-07-28 ENCOUNTER — Encounter: Payer: Self-pay | Admitting: Internal Medicine

## 2020-07-28 ENCOUNTER — Encounter: Payer: Self-pay | Admitting: Family Medicine

## 2020-07-28 ENCOUNTER — Other Ambulatory Visit: Payer: Self-pay

## 2020-07-28 ENCOUNTER — Ambulatory Visit (INDEPENDENT_AMBULATORY_CARE_PROVIDER_SITE_OTHER): Payer: 59 | Admitting: Family Medicine

## 2020-07-28 VITALS — BP 138/98 | HR 76 | Wt 206.6 lb

## 2020-07-28 DIAGNOSIS — Z7185 Encounter for immunization safety counseling: Secondary | ICD-10-CM | POA: Diagnosis not present

## 2020-07-28 DIAGNOSIS — R030 Elevated blood-pressure reading, without diagnosis of hypertension: Secondary | ICD-10-CM

## 2020-07-28 DIAGNOSIS — E119 Type 2 diabetes mellitus without complications: Secondary | ICD-10-CM

## 2020-07-28 DIAGNOSIS — R7401 Elevation of levels of liver transaminase levels: Secondary | ICD-10-CM

## 2020-07-28 DIAGNOSIS — E1169 Type 2 diabetes mellitus with other specified complication: Secondary | ICD-10-CM | POA: Diagnosis not present

## 2020-07-28 DIAGNOSIS — E785 Hyperlipidemia, unspecified: Secondary | ICD-10-CM

## 2020-07-28 LAB — POCT GLYCOSYLATED HEMOGLOBIN (HGB A1C): Hemoglobin A1C: 6.8 % — AB (ref 4.0–5.6)

## 2020-07-28 LAB — CBC WITH DIFFERENTIAL/PLATELET
EOS (ABSOLUTE): 0.5 10*3/uL — ABNORMAL HIGH (ref 0.0–0.4)
Hematocrit: 47.8 % (ref 37.5–51.0)
Hemoglobin: 16.5 g/dL (ref 13.0–17.7)
Lymphocytes Absolute: 2.7 10*3/uL (ref 0.7–3.1)
MCH: 30.5 pg (ref 26.6–33.0)

## 2020-07-28 LAB — COMPREHENSIVE METABOLIC PANEL: Globulin, Total: 2.7 g/dL (ref 1.5–4.5)

## 2020-07-28 LAB — LIPID PANEL

## 2020-07-28 LAB — PTH, INTACT AND CALCIUM

## 2020-07-28 MED ORDER — LISINOPRIL 10 MG PO TABS: 10.0000 mg | ORAL_TABLET | Freq: Every day | ORAL | 1 refills | Status: DC

## 2020-07-28 NOTE — Progress Notes (Signed)
Subjective:    Patient ID: Seth Underwood, male    DOB: August 14, 1970, 50 y.o.   MRN: [id number]  Adric Wrede is a 50 y.o. male who presents for follow-up of Type 2 diabetes mellitus.  His last A1c was 14% Reports feeling much improved and asymptomatic. He would like to cut back on medication if possible.    Patient is checking home blood sugars.   Home blood sugar records: BGs range between 75 and 120 How often is blood sugars being checked: twice a day Current symptoms include: visual disturbances. Patient denies increased appetite, nausea, vomiting and weight loss.  Patient is not checking their feet daily. Any Foot concerns (callous, ulcer, wound, thickened nails, toenail fungus, skin fungus, hammer toe): none Last dilated eye exam: 2022 [place]  Current treatments: doing ok on DM meds. has some diarrhea. Medication compliance: good  Current diet: in general, a "healthy" diet   Current exercise: walking Known diabetic complications: none  The following portions of the patient's history were reviewed and updated as appropriate: allergies, current medications, past medical history, past social history and problem list.  ROS as in subjective above.     Objective:    Physical Exam Alert and in no distress otherwise not examined.  Blood pressure (!) 138/98, pulse 76, weight 206 lb 9.6 oz (93.7 kg).  Lab Review Diabetic Labs Latest Ref Rng & Units 07/28/2020 05/22/2020 01/22/2018  HbA1c 4.0 - 5.6 % 6.8(A) 14.0(A) -  Micro/Creat Ratio 0 - 29 mg/g creat - 18 -  Chol 100 - 199 mg/dL - 119(E) -  HDL >17 mg/dL - 41 -  Calc LDL 0 - 99 mg/dL - 408(X) -  Triglycerides 0 - 149 mg/dL - 448(J) -  Creatinine 0.76 - 1.27 mg/dL - 8.56 3.14(H)   BP/Weight 07/28/2020 06/08/2020 06/01/2020 05/22/2020 01/22/2018  Systolic BP 138 - 128 130 127  Diastolic BP 98 - 90 90 95  Wt. (Lbs) 206.6 211.6 212.6 211.8 215  BMI 31.41 32.17 33.3 33.17 31.75   No flowsheet data  found.  Gaelen  reports that he has never smoked. He has never used smokeless tobacco. He reports current alcohol use. He reports that he does not use drugs.     Assessment & Plan:    New onset type 2 diabetes mellitus (HCC) - Plan: HgB A1c, CBC with Differential/Platelet, Comprehensive metabolic panel, TSH, T4, free, lisinopril (ZESTRIL) 10 MG tablet  Hyperlipidemia associated with type 2 diabetes mellitus (HCC) - Plan: Lipid panel  Hypercalcemia - Plan: Comprehensive metabolic panel, PTH, Intact and Calcium  Immunization counseling  Elevated blood pressure reading without diagnosis of hypertension - Plan: lisinopril (ZESTRIL) 10 MG tablet  Elevated ALT measurement - Plan: Comprehensive metabolic panel  1. Rx changes: reduce metformin to once daily per patient request and add lisinopril Hgb A1c 6.8%  2. Education: Reviewed 'ABCs' of diabetes management (respective goals in parentheses):  A1C (<7), blood pressure (<130/80), and cholesterol (LDL <100). 3. Compliance at present is estimated to be good. Efforts to improve compliance (if necessary) will be directed at dietary modifications: low sugar and low carbohydrate diet, increased exercise and regular blood sugar monitoring: daily. 4. Elevated BP- start on lisinopril due to DM and watch sodium.  5. Call and schedule a diabetic eye exam 6. Recheck calcium level and add PTH 7. Follow up: 5 months along with a CPE. Discussed that he is overdue for prostate and colon cancer screenings as well as immunizations.  He declines all of this today.

## 2020-07-28 NOTE — Patient Instructions (Signed)
Your hemoglobin A1c today has improved to 6.8% which means your diabetes is now very well controlled.  You may cut back to once daily Metformin, with your breakfast and keep a close eye on your blood sugars.  If you see the blood sugars rising (with several blood sugar readings),  you can go back to twice daily.  Let me know if this happens.  I am adding a blood pressure medication called lisinopril to help protect your kidneys due to the diabetes.  This is standard of care.  Continue on your other medications  Continue eating a healthy diet and try to increase your physical activity now that the weather is nice.  Please call and schedule a diabetic eye exam now that your blood sugar has leveled out.  I do recommend colon cancer screening, prostate screening and updating vaccines such as a pneumonia vaccine.  We can do this at your complete physical exam in 4 to 5 months.  I will be in touch with your lab results.

## 2020-07-29 LAB — COMPREHENSIVE METABOLIC PANEL
ALT: 26 IU/L (ref 0–44)
AST: 20 IU/L (ref 0–40)
Albumin/Globulin Ratio: 1.6 (ref 1.2–2.2)
Albumin: 4.4 g/dL (ref 4.0–5.0)
BUN/Creatinine Ratio: 27 — ABNORMAL HIGH (ref 9–20)
BUN: 23 mg/dL (ref 6–24)
Bilirubin Total: 0.5 mg/dL (ref 0.0–1.2)
CO2: 20 mmol/L (ref 20–29)
Calcium: 9.5 mg/dL (ref 8.7–10.2)
Chloride: 105 mmol/L (ref 96–106)
Creatinine, Ser: 0.86 mg/dL (ref 0.76–1.27)
Glucose: 104 mg/dL — ABNORMAL HIGH (ref 65–99)
Potassium: 4.4 mmol/L (ref 3.5–5.2)
Sodium: 142 mmol/L (ref 134–144)
Total Protein: 7.1 g/dL (ref 6.0–8.5)
eGFR: 105 mL/min/{1.73_m2} (ref >59)

## 2020-07-29 LAB — CBC WITH DIFFERENTIAL/PLATELET
Basophils Absolute: 0.1 10*3/uL (ref 0.0–0.2)
Basos: 1 %
Eos: 7 %
Immature Grans (Abs): 0 10*3/uL (ref 0.0–0.1)
Immature Granulocytes: 0 %
Lymphs: 38 %
MCHC: 34.5 g/dL (ref 31.5–35.7)
MCV: 88 fL (ref 79–97)
Monocytes Absolute: 0.6 10*3/uL (ref 0.1–0.9)
Monocytes: 8 %
Neutrophils Absolute: 3.2 10*3/uL (ref 1.4–7.0)
Neutrophils: 46 %
Platelets: 272 10*3/uL (ref 150–450)
RBC: 5.41 x10E6/uL (ref 4.14–5.80)
RDW: 13 % (ref 11.6–15.4)
WBC: 7 10*3/uL (ref 3.4–10.8)

## 2020-07-29 LAB — T4, FREE: Free T4: 1.06 ng/dL (ref 0.82–1.77)

## 2020-07-29 LAB — LIPID PANEL
Chol/HDL Ratio: 3 ratio (ref 0.0–5.0)
LDL Chol Calc (NIH): 76 mg/dL (ref 0–99)
Triglycerides: 68 mg/dL (ref 0–149)

## 2020-07-29 LAB — TSH: TSH: 1.41 u[IU]/mL (ref 0.450–4.500)

## 2020-07-29 LAB — PTH, INTACT AND CALCIUM: PTH: 30 pg/mL (ref 15–65)

## 2020-08-14 ENCOUNTER — Other Ambulatory Visit: Payer: Self-pay | Admitting: Family Medicine

## 2020-08-14 DIAGNOSIS — E119 Type 2 diabetes mellitus without complications: Secondary | ICD-10-CM

## 2020-08-17 ENCOUNTER — Encounter: Payer: Self-pay | Admitting: Internal Medicine

## 2020-08-18 ENCOUNTER — Encounter: Payer: Self-pay | Admitting: Family Medicine

## 2020-08-20 ENCOUNTER — Other Ambulatory Visit: Payer: Self-pay | Admitting: Family Medicine

## 2020-10-18 ENCOUNTER — Encounter: Payer: Self-pay | Admitting: Internal Medicine

## 2020-11-11 ENCOUNTER — Other Ambulatory Visit: Payer: Self-pay | Admitting: Family Medicine

## 2020-11-11 DIAGNOSIS — E119 Type 2 diabetes mellitus without complications: Secondary | ICD-10-CM

## 2020-11-23 ENCOUNTER — Other Ambulatory Visit: Payer: Self-pay | Admitting: Family Medicine

## 2020-11-23 DIAGNOSIS — E119 Type 2 diabetes mellitus without complications: Secondary | ICD-10-CM

## 2020-12-14 ENCOUNTER — Encounter: Payer: 59 | Admitting: Family Medicine

## 2020-12-20 ENCOUNTER — Encounter: Payer: Self-pay | Admitting: Family Medicine

## 2020-12-22 ENCOUNTER — Other Ambulatory Visit: Payer: Self-pay | Admitting: Family Medicine

## 2020-12-22 DIAGNOSIS — E119 Type 2 diabetes mellitus without complications: Secondary | ICD-10-CM

## 2021-01-11 ENCOUNTER — Other Ambulatory Visit: Payer: Self-pay | Admitting: Family Medicine

## 2021-01-11 LAB — HM DIABETES EYE EXAM

## 2021-01-21 ENCOUNTER — Other Ambulatory Visit: Payer: Self-pay | Admitting: Family Medicine

## 2021-01-21 DIAGNOSIS — E119 Type 2 diabetes mellitus without complications: Secondary | ICD-10-CM

## 2021-01-27 ENCOUNTER — Other Ambulatory Visit: Payer: Self-pay | Admitting: Family Medicine

## 2021-01-27 DIAGNOSIS — E119 Type 2 diabetes mellitus without complications: Secondary | ICD-10-CM

## 2021-01-27 DIAGNOSIS — R030 Elevated blood-pressure reading, without diagnosis of hypertension: Secondary | ICD-10-CM

## 2021-01-29 NOTE — Patient Instructions (Signed)
Preventive Care 40-51 Years Old, Male Preventive care refers to lifestyle choices and visits with your health care provider that can promote health and wellness. This includes: A yearly physical exam. This is also called an annual wellness visit. Regular dental and eye exams. Immunizations. Screening for certain conditions. Healthy lifestyle choices, such as: Eating a healthy diet. Getting regular exercise. Not using drugs or products that contain nicotine and tobacco. Limiting alcohol use. What can I expect for my preventive care visit? Physical exam Your health care provider will check your: Height and weight. These may be used to calculate your BMI (body mass index). BMI is a measurement that tells if you are at a healthy weight. Heart rate and blood pressure. Body temperature. Skin for abnormal spots. Counseling Your health care provider may ask you questions about your: Past medical problems. Family's medical history. Alcohol, tobacco, and drug use. Emotional well-being. Home life and relationship well-being. Sexual activity. Diet, exercise, and sleep habits. Work and work environment. Access to firearms. What immunizations do I need? Vaccines are usually given at various ages, according to a schedule. Your health care provider will recommend vaccines for you based on your age, medical history, and lifestyle or other factors, such as travel or where you work. What tests do I need? Blood tests Lipid and cholesterol levels. These may be checked every 5 years, or more often if you are over 50 years old. Hepatitis C test. Hepatitis B test. Screening Lung cancer screening. You may have this screening every year starting at age 55 if you have a 30-pack-year history of smoking and currently smoke or have quit within the past 15 years. Prostate cancer screening. Recommendations will vary depending on your family history and other risks. Genital exam to check for testicular cancer  or hernias. Colorectal cancer screening. All adults should have this screening starting at age 50 and continuing until age 75. Your health care provider may recommend screening at age 45 if you are at increased risk. You will have tests every 1-10 years, depending on your results and the type of screening test. Diabetes screening. This is done by checking your blood sugar (glucose) after you have not eaten for a while (fasting). You may have this done every 1-3 years. STD (sexually transmitted disease) testing, if you are at risk. Follow these instructions at home: Eating and drinking  Eat a diet that includes fresh fruits and vegetables, whole grains, lean protein, and low-fat dairy products. Take vitamin and mineral supplements as recommended by your health care provider. Do not drink alcohol if your health care provider tells you not to drink. If you drink alcohol: Limit how much you have to 0-2 drinks a day. Be aware of how much alcohol is in your drink. In the [place] one drink equals one 12 oz bottle of beer (355 mL), one 5 oz glass of wine (148 mL), or one 1 oz glass of hard liquor (44 mL). Lifestyle Take daily care of your teeth and gums. Brush your teeth every morning and night with fluoride toothpaste. Floss one time each day. Stay active. Exercise for at least 30 minutes 5 or more days each week. Do not use any products that contain nicotine or tobacco, such as cigarettes, e-cigarettes, and chewing tobacco. If you need help quitting, ask your health care provider. Do not use drugs. If you are sexually active, practice safe sex. Use a condom or other form of protection to prevent STIs (sexually transmitted infections). If told by your   health care provider, take low-dose aspirin daily starting at age 50. Find healthy ways to cope with stress, such as: Meditation, yoga, or listening to music. Journaling. Talking to a trusted person. Spending time with friends and  family. Safety Always wear your seat belt while driving or riding in a vehicle. Do not drive: If you have been drinking alcohol. Do not ride with someone who has been drinking. When you are tired or distracted. While texting. Wear a helmet and other protective equipment during sports activities. If you have firearms in your house, make sure you follow all gun safety procedures. What's next? Go to your health care provider once a year for an annual wellness visit. Ask your health care provider how often you should have your eyes and teeth checked. Stay up to date on all vaccines. This information is not intended to replace advice given to you by your health care provider. Make sure you discuss any questions you have with your health care provider. Document Revised: 06/16/2020 Document Reviewed: 04/02/2018 Elsevier Patient Education  2022 [employer]   

## 2021-01-29 NOTE — Progress Notes (Signed)
Subjective:    Patient ID: Seth Underwood, male    DOB: 18-Jan-1971, 50 y.o.   MRN: [id number]  HPI Chief Complaint  Patient presents with   Annual Exam   He is here for a complete physical exam and to follow-up on chronic health conditions.  Other providers: None  Diabetes- last Hgb A1c 6.8% in April 2022 Does not check BS at home often. FBS 110 on Friday Farxiga and metformin in the morning   HL- atorvastatin daily  Diabetic eye exam was good per patient.    Father with prostate cancer.   Social history: Lives with wife, daughter, works as Public affairs consultant,  No Smoking or drug use,  does drink alcohol about once a month Diet: fairly healthy  Exercise: nothing. Mows grass   Immunizations: declines.   Health maintenance:   Colonoscopy: never  Last PSA: never  Last Dental Exam: years ago  Last Eye Exam: UTD   Wears seatbelt always, uses sunscreen, smoke detectors in home and functioning, does not text while driving, feels safe in home environment.  Reviewed allergies, medications, past medical, surgical, family, and social history.    Review of Systems Review of Systems Constitutional: -fever, -chills, -sweats, -unexpected weight change,-fatigue ENT: -runny nose, -ear pain, -sore throat Cardiology:  -chest pain, -palpitations, -edema Respiratory: -cough, -shortness of breath, -wheezing Gastroenterology: -abdominal pain, -nausea, -vomiting, -diarrhea, -constipation  Hematology: -bleeding or bruising problems Musculoskeletal: -arthralgias, -myalgias, -joint swelling, -back pain Ophthalmology: -vision changes Urology: -dysuria, -difficulty urinating, -hematuria, -urinary frequency, -urgency Neurology: -headache, -weakness, -tingling, -numbness       Objective:   Physical Exam BP (!) 158/94 (BP Location: Right Arm, Patient Position: Sitting)   Pulse (!) 54   Ht 5' 5.5" (1.664 m)   Wt 201 lb (91.2 kg)   SpO2 98%   BMI 32.94 kg/m   General  Appearance:    Alert, cooperative, no distress, appears stated age  Head:    Normocephalic, without obvious abnormality, atraumatic  Eyes:    PERRL, conjunctiva/corneas clear, EOM's intact  Ears:    Normal TM's and external ear canals  Nose: Mask on  Throat: Mask on  Neck:   Supple, no lymphadenopathy;  thyroid:  no   enlargement/tenderness/nodules; no JVD  Back:    Spine nontender, no curvature, ROM normal, no CVA     tenderness  Lungs:     Clear to auscultation bilaterally without wheezes, rales or     ronchi; respirations unlabored  Chest Wall:    No tenderness or deformity   Heart:    Regular rate and rhythm, S1 and S2 normal, no murmur, rub   or gallop  Breast Exam:    No chest wall tenderness, masses or gynecomastia  Abdomen:     Soft, non-tender, nondistended, normoactive bowel sounds,    no masses, no hepatosplenomegaly  Genitalia:   Declines     Extremities:   No clubbing, cyanosis or edema  Pulses:   2+ and symmetric all extremities  Skin:   Skin color, texture, turgor normal, no rashes or lesions  Lymph nodes:   Cervical, supraclavicular, and axillary nodes normal  Neurologic:   CNII-XII intact, normal strength, sensation and gait          Psych:   Normal mood, affect, hygiene and grooming.          Assessment & Plan:  Routine general medical examination at a health care facility - Plan: CBC with Differential/Platelet, Comprehensive metabolic panel, TSH, T4, free,  T3, Lipid panel -Preventive health care reviewed.  Counseled on healthy lifestyle including diet and exercise.  Recommend regular dental and eye exams.  Immunizations reviewed and he declines.  Discussed safety  Hyperlipidemia associated with type 2 diabetes mellitus (HCC) - Plan: Lipid panel -Continue statin therapy and low-fat diet.  Follow-up pending lipid panel results.  Diabetes mellitus type 2 in obese (HCC) - Plan: TSH, T4, free, T3, Hemoglobin A1c -Doing well on current medications.  Continue checking  blood sugars.  Follow-up pending A1c result.  Screen for colon cancer - Plan: Ambulatory referral to Gastroenterology  Need for hepatitis C screening test - Plan: Hepatitis C antibody -Done per screening guidelines  Immunization declined  Screening for prostate cancer - Plan: PSA  Family history of prostate cancer in father - Plan: PSA

## 2021-01-30 ENCOUNTER — Encounter: Payer: Self-pay | Admitting: Family Medicine

## 2021-01-30 ENCOUNTER — Ambulatory Visit (INDEPENDENT_AMBULATORY_CARE_PROVIDER_SITE_OTHER): Payer: 59 | Admitting: Family Medicine

## 2021-01-30 ENCOUNTER — Other Ambulatory Visit: Payer: Self-pay

## 2021-01-30 VITALS — BP 158/94 | HR 54 | Ht 65.5 in | Wt 201.0 lb

## 2021-01-30 DIAGNOSIS — E1169 Type 2 diabetes mellitus with other specified complication: Secondary | ICD-10-CM

## 2021-01-30 DIAGNOSIS — Z1159 Encounter for screening for other viral diseases: Secondary | ICD-10-CM

## 2021-01-30 DIAGNOSIS — Z1211 Encounter for screening for malignant neoplasm of colon: Secondary | ICD-10-CM

## 2021-01-30 DIAGNOSIS — Z2821 Immunization not carried out because of patient refusal: Secondary | ICD-10-CM

## 2021-01-30 DIAGNOSIS — Z0000 Encounter for general adult medical examination without abnormal findings: Secondary | ICD-10-CM

## 2021-01-30 DIAGNOSIS — Z125 Encounter for screening for malignant neoplasm of prostate: Secondary | ICD-10-CM

## 2021-01-30 DIAGNOSIS — Z8042 Family history of malignant neoplasm of prostate: Secondary | ICD-10-CM

## 2021-01-30 DIAGNOSIS — E669 Obesity, unspecified: Secondary | ICD-10-CM

## 2021-01-30 DIAGNOSIS — E785 Hyperlipidemia, unspecified: Secondary | ICD-10-CM

## 2021-01-31 LAB — HEMOGLOBIN A1C
Est. average glucose Bld gHb Est-mCnc: 117 mg/dL
Hgb A1c MFr Bld: 5.7 % — ABNORMAL HIGH (ref 4.8–5.6)

## 2021-01-31 LAB — CBC WITH DIFFERENTIAL/PLATELET
Basophils Absolute: 0 10*3/uL (ref 0.0–0.2)
Basos: 1 %
EOS (ABSOLUTE): 0.3 10*3/uL (ref 0.0–0.4)
Eos: 4 %
Hematocrit: 47.2 % (ref 37.5–51.0)
Hemoglobin: 16.1 g/dL (ref 13.0–17.7)
Immature Grans (Abs): 0 10*3/uL (ref 0.0–0.1)
Immature Granulocytes: 0 %
Lymphocytes Absolute: 3.1 10*3/uL (ref 0.7–3.1)
Lymphs: 45 %
MCH: 30.5 pg (ref 26.6–33.0)
MCHC: 34.1 g/dL (ref 31.5–35.7)
MCV: 89 fL (ref 79–97)
Monocytes Absolute: 0.5 10*3/uL (ref 0.1–0.9)
Monocytes: 7 %
Neutrophils Absolute: 3 10*3/uL (ref 1.4–7.0)
Neutrophils: 43 %
Platelets: 258 10*3/uL (ref 150–450)
RBC: 5.28 x10E6/uL (ref 4.14–5.80)
RDW: 13.5 % (ref 11.6–15.4)
WBC: 6.9 10*3/uL (ref 3.4–10.8)

## 2021-01-31 LAB — TSH: TSH: 1.66 u[IU]/mL (ref 0.450–4.500)

## 2021-01-31 LAB — COMPREHENSIVE METABOLIC PANEL
ALT: 16 IU/L (ref 0–44)
AST: 14 IU/L (ref 0–40)
Albumin/Globulin Ratio: 2 (ref 1.2–2.2)
Albumin: 4.3 g/dL (ref 4.0–5.0)
Alkaline Phosphatase: 69 IU/L (ref 44–121)
BUN/Creatinine Ratio: 27 — ABNORMAL HIGH (ref 9–20)
BUN: 24 mg/dL (ref 6–24)
Bilirubin Total: 0.6 mg/dL (ref 0.0–1.2)
CO2: 21 mmol/L (ref 20–29)
Calcium: 9.5 mg/dL (ref 8.7–10.2)
Chloride: 106 mmol/L (ref 96–106)
Creatinine, Ser: 0.89 mg/dL (ref 0.76–1.27)
Globulin, Total: 2.2 g/dL (ref 1.5–4.5)
Glucose: 86 mg/dL (ref 70–99)
Potassium: 4.4 mmol/L (ref 3.5–5.2)
Sodium: 141 mmol/L (ref 134–144)
Total Protein: 6.5 g/dL (ref 6.0–8.5)
eGFR: 104 mL/min/{1.73_m2} (ref >59)

## 2021-01-31 LAB — LIPID PANEL
Chol/HDL Ratio: 2.9 ratio (ref 0.0–5.0)
Cholesterol, Total: 149 mg/dL (ref 100–199)
HDL: 51 mg/dL (ref >39)
LDL Chol Calc (NIH): 84 mg/dL (ref 0–99)
Triglycerides: 68 mg/dL (ref 0–149)
VLDL Cholesterol Cal: 14 mg/dL (ref 5–40)

## 2021-01-31 LAB — T4, FREE: Free T4: 1.05 ng/dL (ref 0.82–1.77)

## 2021-01-31 LAB — PSA: Prostate Specific Ag, Serum: 2 ng/mL (ref 0.0–4.0)

## 2021-01-31 LAB — HEPATITIS C ANTIBODY: Hep C Virus Ab: <0.1 s/co ratio (ref 0.0–0.9)

## 2021-01-31 LAB — T3: T3, Total: 103 ng/dL (ref 71–180)

## 2021-01-31 NOTE — Progress Notes (Signed)
He needs a 3-4 month follow up in our office for diabetes.

## 2021-02-08 ENCOUNTER — Telehealth: Payer: Self-pay

## 2021-02-08 NOTE — Telephone Encounter (Signed)
Left pt a voicemail to call back and discuss message below

## 2021-02-08 NOTE — Telephone Encounter (Signed)
Sent thru pt schedule  ----- Message -----  From: Leigh Aurora  Sent: 02/07/2021   5:12 PM EDT  To: [facility]  Subject: Appointment Request                              Hello.  I dont know who can help me with this question.  Not the current visit I just had but the one prior, Vickie Henson changed my metformin to once daily instead of twice.  When she just recently messaged me about the results stating I was prediabetic, suggested that I go to once daily metformin?   I was expecting her to say not to take the metformin at all now.   Is there a provider that can answer this question for me?  Prior to my visit early this year I was on no medications.   Thank you,  Seth Underwood

## 2021-02-12 ENCOUNTER — Ambulatory Visit: Payer: 59 | Admitting: Nurse Practitioner

## 2021-02-20 ENCOUNTER — Other Ambulatory Visit: Payer: Self-pay

## 2021-02-20 DIAGNOSIS — E119 Type 2 diabetes mellitus without complications: Secondary | ICD-10-CM

## 2021-02-20 MED ORDER — ATORVASTATIN CALCIUM 10 MG PO TABS: 10.0000 mg | ORAL_TABLET | Freq: Every day | ORAL | 0 refills | Status: DC

## 2021-02-20 MED ORDER — METFORMIN HCL 1000 MG PO TABS: 1000.0000 mg | ORAL_TABLET | Freq: Two times a day (BID) | ORAL | 0 refills | Status: DC

## 2021-03-19 ENCOUNTER — Telehealth: Payer: Self-pay

## 2021-03-19 ENCOUNTER — Other Ambulatory Visit: Payer: Self-pay

## 2021-03-19 DIAGNOSIS — E119 Type 2 diabetes mellitus without complications: Secondary | ICD-10-CM

## 2021-03-19 MED ORDER — DAPAGLIFLOZIN PROPANEDIOL 5 MG PO TABS: ORAL_TABLET | ORAL | 1 refills | Status: DC

## 2021-03-19 NOTE — Telephone Encounter (Signed)
Refil req from [facility] for FArxiga 5mg 

## 2021-03-19 NOTE — Telephone Encounter (Signed)
Done KH 

## 2021-03-23 NOTE — Telephone Encounter (Signed)
done

## 2021-05-18 ENCOUNTER — Other Ambulatory Visit: Payer: Self-pay | Admitting: Family Medicine

## 2021-05-18 DIAGNOSIS — E119 Type 2 diabetes mellitus without complications: Secondary | ICD-10-CM

## 2021-10-09 ENCOUNTER — Ambulatory Visit (INDEPENDENT_AMBULATORY_CARE_PROVIDER_SITE_OTHER): Payer: Commercial Managed Care - HMO | Admitting: Nurse Practitioner

## 2021-10-09 ENCOUNTER — Encounter: Payer: Self-pay | Admitting: Nurse Practitioner

## 2021-10-09 VITALS — BP 130/82 | HR 65 | Temp 97.4°F | Resp 14 | Ht 65.5 in | Wt 220.6 lb

## 2021-10-09 DIAGNOSIS — Z1211 Encounter for screening for malignant neoplasm of colon: Secondary | ICD-10-CM

## 2021-10-09 DIAGNOSIS — E1169 Type 2 diabetes mellitus with other specified complication: Secondary | ICD-10-CM | POA: Diagnosis not present

## 2021-10-09 DIAGNOSIS — E785 Hyperlipidemia, unspecified: Secondary | ICD-10-CM | POA: Diagnosis not present

## 2021-10-09 DIAGNOSIS — R030 Elevated blood-pressure reading, without diagnosis of hypertension: Secondary | ICD-10-CM | POA: Diagnosis not present

## 2021-10-09 DIAGNOSIS — Z0000 Encounter for general adult medical examination without abnormal findings: Secondary | ICD-10-CM

## 2021-10-09 DIAGNOSIS — E119 Type 2 diabetes mellitus without complications: Secondary | ICD-10-CM

## 2021-10-09 LAB — CBC
HCT: 44.3 % (ref 39.0–52.0)
Hemoglobin: 14.9 g/dL (ref 13.0–17.0)
MCHC: 33.6 g/dL (ref 30.0–36.0)
MCV: 92.6 fl (ref 78.0–100.0)
Platelets: 242 10*3/uL (ref 150.0–400.0)
RBC: 4.79 Mil/uL (ref 4.22–5.81)
RDW: 13.8 % (ref 11.5–15.5)
WBC: 6.2 10*3/uL (ref 4.0–10.5)

## 2021-10-09 LAB — COMPREHENSIVE METABOLIC PANEL
ALT: 37 U/L (ref 0–53)
AST: 29 U/L (ref 0–37)
Albumin: 4 g/dL (ref 3.5–5.2)
Alkaline Phosphatase: 52 U/L (ref 39–117)
BUN: 16 mg/dL (ref 6–23)
CO2: 28 mEq/L (ref 19–32)
Calcium: 9.2 mg/dL (ref 8.4–10.5)
Chloride: 104 mEq/L (ref 96–112)
Creatinine, Ser: 0.91 mg/dL (ref 0.40–1.50)
GFR: 97.59 mL/min (ref >60.00)
Glucose, Bld: 101 mg/dL — ABNORMAL HIGH (ref 70–99)
Potassium: 4 mEq/L (ref 3.5–5.1)
Sodium: 138 mEq/L (ref 135–145)
Total Bilirubin: 0.6 mg/dL (ref 0.2–1.2)
Total Protein: 6.7 g/dL (ref 6.0–8.3)

## 2021-10-09 LAB — LIPID PANEL
Cholesterol: 187 mg/dL (ref 0–200)
HDL: 51.3 mg/dL (ref >39.00)
LDL Cholesterol: 112 mg/dL — ABNORMAL HIGH (ref 0–99)
NonHDL: 135.66
Total CHOL/HDL Ratio: 4
Triglycerides: 117 mg/dL (ref 0.0–149.0)
VLDL: 23.4 mg/dL (ref 0.0–40.0)

## 2021-10-09 LAB — HEMOGLOBIN A1C: Hgb A1c MFr Bld: 6.2 % (ref 4.6–6.5)

## 2021-10-09 LAB — MICROALBUMIN / CREATININE URINE RATIO
Creatinine,U: 96.3 mg/dL
Microalb Creat Ratio: 0.9 mg/g (ref 0.0–30.0)
Microalb, Ur: 0.9 mg/dL (ref 0.0–1.9)

## 2021-10-09 NOTE — Assessment & Plan Note (Signed)
Patient originally had A1c of 14 last A1c to 5.7 he has been out of his medications for approximately 2 months.  Patient wants to hold off on medications until lab work returns.  Patient was maintained on metformin 1000 mg twice daily and Farxiga 5 mg daily.  Pending A1c

## 2021-10-09 NOTE — Progress Notes (Signed)
New Patient Office Visit  Subjective    Patient ID: Seth Underwood, male    DOB: 08/23/1970  Age: 51 y.o. MRN: [id number]  CC:  Chief Complaint  Patient presents with   Establish Care    Previous PCP with [facility]-Vickie Henson   Medication Refill    Has been out of medications for about 2 months    HPI Seth Underwood presents to establish care   DM2: Has not lately but will check it daily. Metformin and farxiga   HLD: 2 meals a day and snack ( most fo the time a cake of some sort). Coffee in the mornings and water and diet drink No exercise   Colonoscopy: Patient is never completed but amendable to having it done in [place] PSA is up to date Diabetic eye exam: 2022  Outpatient Encounter Medications as of 10/09/2021  Medication Sig   atorvastatin (LIPITOR) 10 MG tablet TAKE 1 TABLET BY MOUTH EVERY DAY (Patient not taking: Reported on 10/09/2021)   dapagliflozin propanediol (FARXIGA) 5 MG TABS tablet TAKE 1 TABLET BY MOUTH EVERY DAY BEFORE BREAKFAST (Patient not taking: Reported on 10/09/2021)   FREESTYLE LITE test strip USE TO TEST 1-2 TIMES DAILY (Patient not taking: Reported on 10/09/2021)   lisinopril (ZESTRIL) 10 MG tablet TAKE 1 TABLET BY MOUTH EVERY DAY (Patient not taking: Reported on 10/09/2021)   metFORMIN (GLUCOPHAGE) 1000 MG tablet TAKE 1 TABLET (1,000 MG TOTAL) BY MOUTH 2 (TWO) TIMES DAILY WITH A MEAL. (Patient not taking: Reported on 10/09/2021)   [DISCONTINUED] Blood Glucose Monitoring Suppl (ONETOUCH VERIO) w/Device KIT Test 1-2 times daily   [DISCONTINUED] OneTouch Delica Lancets 40J MISC Test 1-2 times daily   No facility-administered encounter medications on file as of 10/09/2021.    Past Medical History:  Diagnosis Date   Elevated ALT measurement 05/23/2020   Hypercalcemia 05/23/2020   Hyperlipidemia associated with type 2 diabetes mellitus ([place]) 05/23/2020   Hypertension     Past Surgical History:  Procedure Laterality  Date   NO PAST SURGERIES      Family History  Problem Relation Age of Onset   Diabetes Mother    Migraines Mother    Cancer Father 38       prostate   Learning disabilities Father    Kidney cancer Brother    Cancer Maternal Aunt        thyroid   Diabetes Maternal Grandmother     Social History   Socioeconomic History   Marital status: Married    Spouse name: Seth Underwood   Number of children: 3   Years of education: Not on file   Highest education level: High school graduate  Occupational History   Not on file  Tobacco Use   Smoking status: Never    Passive exposure: Past   Smokeless tobacco: Never  Vaping Use   Vaping Use: Never used  Substance and Sexual Activity   Alcohol use: Yes    Alcohol/week: 5.0 standard drinks of alcohol    Types: 2 Glasses of wine, 3 Cans of beer per week    Comment: occ. once a day beer or mixed   Drug use: Never   Sexual activity: Yes    Birth control/protection: None  Other Topics Concern   Not on file  Social History Narrative   Fulltime: Nurse, learning disability   Social Determinants of Health   Financial Resource Strain: Not on file  Food Insecurity: Not on file  Transportation Needs: Not on  file  Physical Activity: Not on file  Stress: Not on file  Social Connections: Not on file  Intimate Partner Violence: Not on file    Review of Systems  Constitutional:  Positive for malaise/fatigue. Negative for chills and fever.  Respiratory:  Negative for cough and shortness of breath.   Cardiovascular:  Negative for chest pain and leg swelling.  Gastrointestinal:  Negative for constipation, nausea and vomiting.       Bm daily  Genitourinary:  Negative for dysuria and hematuria.       Nocturia "-"  Neurological:  Negative for dizziness, tingling and headaches.  Psychiatric/Behavioral:  Negative for hallucinations and suicidal ideas.         Objective    BP 130/82   Pulse 65   Temp (!) 97.4 F (36.3 C)   Resp 14   Ht 5' 5.5"  (1.664 m)   Wt 220 lb 9 oz (100 kg)   SpO2 96%   BMI 36.15 kg/m   Physical Exam Vitals reviewed.  Constitutional:      Appearance: Normal appearance. He is obese.  HENT:     Right Ear: Tympanic membrane, ear canal and external ear normal.     Left Ear: Tympanic membrane, ear canal and external ear normal.     Mouth/Throat:     Mouth: Mucous membranes are moist.     Pharynx: Oropharynx is clear.  Eyes:     Extraocular Movements: Extraocular movements intact.     Pupils: Pupils are equal, round, and reactive to light.     Comments: Wears corrective lenses  Cardiovascular:     Rate and Rhythm: Normal rate and regular rhythm.     Pulses: Normal pulses.     Heart sounds: Normal heart sounds.  Pulmonary:     Effort: Pulmonary effort is normal.     Breath sounds: Normal breath sounds.  Abdominal:     General: Bowel sounds are normal. There is no distension.     Palpations: There is no mass.     Tenderness: There is no abdominal tenderness.     Hernia: No hernia is present.  Musculoskeletal:     Right lower leg: No edema.     Left lower leg: No edema.  Lymphadenopathy:     Cervical: No cervical adenopathy.  Skin:    General: Skin is warm.  Neurological:     General: No focal deficit present.     Mental Status: He is alert.     Deep Tendon Reflexes:     Reflex Scores:      Bicep reflexes are 2+ on the right side and 2+ on the left side.      Patellar reflexes are 2+ on the right side and 2+ on the left side.    Comments: Bilateral upper and lower extremity strength 5/5  Psychiatric:        Mood and Affect: Mood normal.        Behavior: Behavior normal.        Thought Content: Thought content normal.        Judgment: Judgment normal.         Assessment & Plan:   Problem List Items Addressed This Visit       Endocrine   New onset type 2 diabetes mellitus ([place])    Patient originally had A1c of 14 last A1c to 5.7 he has been out of his medications for approximately  2 months.  Patient wants to hold off on  medications until lab work returns.  Patient was maintained on metformin 1000 mg twice daily and Farxiga 5 mg daily.  Pending A1c      Relevant Orders   Microalbumin/Creatinine Ratio, Urine   CBC   Lipid panel   Comprehensive metabolic panel   Hemoglobin A1c   Hyperlipidemia associated with type 2 diabetes mellitus ([place]) - Primary    Patient has been maintained on atorvastatin.  Been on medication for 2 months wants to hold off on medication until blood work results.  Did discuss that his cholesterol was perfect we can use atorvastatin for risk reduction in regards to any acute coronary event related to his diabetes.  Pending lab results      Relevant Orders   Lipid panel   Comprehensive metabolic panel     Other   Encounter for medical examination to establish care    Patient's previous provider left practice brief EMR reviewed.      Other Visit Diagnoses     Elevated blood pressure reading without diagnosis of hypertension       Screening for colon cancer       Relevant Orders   Ambulatory referral to Gastroenterology       Return in about 4 months (around 02/08/2022) for DM recheck.   Romilda Garret, NP

## 2021-10-09 NOTE — Assessment & Plan Note (Signed)
Patient's previous provider left practice brief EMR reviewed.

## 2021-10-09 NOTE — Patient Instructions (Signed)
Nice to see you today I want to see you in 4 months for a follow up I will be in touch with the labs once I have them

## 2021-10-09 NOTE — Assessment & Plan Note (Signed)
Patient has been maintained on atorvastatin.  Been on medication for 2 months wants to hold off on medication until blood work results.  Did discuss that his cholesterol was perfect we can use atorvastatin for risk reduction in regards to any acute coronary event related to his diabetes.  Pending lab results

## 2021-10-10 ENCOUNTER — Encounter: Payer: Self-pay | Admitting: Nurse Practitioner

## 2021-10-22 ENCOUNTER — Encounter: Payer: Self-pay | Admitting: Optometry

## 2021-10-24 ENCOUNTER — Encounter (HOSPITAL_COMMUNITY): Payer: Self-pay | Admitting: Emergency Medicine

## 2021-10-24 ENCOUNTER — Other Ambulatory Visit: Payer: Self-pay

## 2021-10-24 ENCOUNTER — Ambulatory Visit (HOSPITAL_COMMUNITY)
Admission: EM | Admit: 2021-10-24 | Discharge: 2021-10-24 | Disposition: A | Discharge status: Home / Self Care | Payer: Commercial Managed Care - HMO | Attending: Family Medicine | Admitting: Family Medicine

## 2021-10-24 ENCOUNTER — Ambulatory Visit (INDEPENDENT_AMBULATORY_CARE_PROVIDER_SITE_OTHER): Payer: Commercial Managed Care - HMO

## 2021-10-24 DIAGNOSIS — S60512A Abrasion of left hand, initial encounter: Secondary | ICD-10-CM | POA: Diagnosis not present

## 2021-10-24 DIAGNOSIS — S0990XA Unspecified injury of head, initial encounter: Secondary | ICD-10-CM | POA: Diagnosis not present

## 2021-10-24 DIAGNOSIS — Z23 Encounter for immunization: Secondary | ICD-10-CM

## 2021-10-24 DIAGNOSIS — R0781 Pleurodynia: Secondary | ICD-10-CM | POA: Diagnosis not present

## 2021-10-24 DIAGNOSIS — R059 Cough, unspecified: Secondary | ICD-10-CM | POA: Diagnosis not present

## 2021-10-24 DIAGNOSIS — R0782 Intercostal pain: Secondary | ICD-10-CM | POA: Diagnosis not present

## 2021-10-24 DIAGNOSIS — T148XXA Other injury of unspecified body region, initial encounter: Secondary | ICD-10-CM

## 2021-10-24 MED ORDER — TETANUS-DIPHTH-ACELL PERTUSSIS 5-2.5-18.5 LF-MCG/0.5 IM SUSY
0.5000 mL | PREFILLED_SYRINGE | Freq: Once | INTRAMUSCULAR | Status: AC
  Administered 2021-10-24: 0.5 mL via INTRAMUSCULAR

## 2021-10-24 MED ORDER — TETANUS-DIPHTH-ACELL PERTUSSIS 5-2.5-18.5 LF-MCG/0.5 IM SUSY
PREFILLED_SYRINGE | INTRAMUSCULAR | Status: AC
  Filled 2021-10-24: qty 0.5

## 2021-10-24 NOTE — ED Notes (Signed)
Notified dr Marlinda Mike of vital signs, particularly blood pressure and nature of patient's complaint

## 2021-10-24 NOTE — Discharge Instructions (Addendum)
Your x-rays did not show any fractures.  Been given a Tdap booster for your tetanus  Ice anything that hurts for the next day or so.  You can continue Tylenol or ibuprofen as needed  You may want to check your blood pressure at the pharmacy a time or 2 in the next couple of weeks when you are feeling better

## 2021-10-24 NOTE — ED Provider Notes (Signed)
[facility]    CSN: [id number] Arrival date & time: 10/24/21  0258      History   Chief Complaint Chief Complaint  Patient presents with   Genia Hotter off of pool ladder on concrete Sunday evening. head,back,leg,heel,finger.Better but not improving as much as needed.now mainly back in the center and to the side. Been taking Tylenol and ibuprofen. - Entered by patient    HPI Seth Underwood is a 51 y.o. male.    Fall   Here for pain in his posterior scalp and in his left lower ribs.  On July 2 he was getting out of of his aboveground pool that sits on concrete.  He was turned facing away from the pool to the sand further and slipped off the ladder falling onto the concrete.  He hit his left posterior head, left mid back/chest, and thigh on the concrete.  No loss of consciousness.  He did have several episodes of vomiting that afternoon and evening after this happened  He does not feel that he lost time at any point.  Right after the accident he did have some swelling where he struck his head, but that has improved, there is sterile there are some.  He does not have a headache now, unless someone touches that tender area.  He has some continued left lower rib pain in the back, about along his posterior axillary line.  That does not hurt worse with breathing, but does hurt worse with movement.  He and his wife also notes some cough when he is arising from a sitting or lying position.  No fever or chills or congestion.  On July 3 and 4 he was able to eat well without problems.  He also has some tenderness along his left medial posterior thigh and some bruising there.  He has some bruising on his right anterior foot around the first MTP.  He is able to walk without problems.  He has an abrasion on his left hand on the hypothenar eminence.  Also has an abrasion on his right anterior foot.  Both abrasions are healing  Last tetanus was probably 15 years ago or more.  On  review of the chart there is no immunization put in the immunization tab.  His blood pressure is elevated here, but he has been taken off all his medications as he improved his sugar to normal and his blood pressure was normal when he last saw family medicine in late June.  Past Medical History:  Diagnosis Date   Elevated ALT measurement 05/23/2020   Hypercalcemia 05/23/2020   Hyperlipidemia associated with type 2 diabetes mellitus (HCC) 05/23/2020   Hypertension     Patient Active Problem List   Diagnosis Date Noted   Encounter for medical examination to establish care 10/09/2021   Hyperlipidemia associated with type 2 diabetes mellitus (HCC) 05/23/2020   Hypercalcemia 05/23/2020   Elevated ALT measurement 05/23/2020   New onset type 2 diabetes mellitus (HCC) 05/22/2020    Past Surgical History:  Procedure Laterality Date   NO PAST SURGERIES         Home Medications    Prior to Admission medications   Not on File    Family History Family History  Problem Relation Age of Onset   Diabetes Mother    Migraines Mother    Cancer Father 70       prostate   Learning disabilities Father    Kidney cancer Brother  Cancer Maternal Aunt        thyroid   Diabetes Maternal Grandmother     Social History Social History   Tobacco Use   Smoking status: Never    Passive exposure: Past   Smokeless tobacco: Never  Vaping Use   Vaping Use: Never used  Substance Use Topics   Alcohol use: Yes    Alcohol/week: 5.0 standard drinks of alcohol    Types: 2 Glasses of wine, 3 Cans of beer per week    Comment: occ. once a day beer or mixed   Drug use: Never     Allergies   Ceclor [cefaclor]   Review of Systems Review of Systems   Physical Exam Triage Vital Signs ED Triage Vitals  Enc Vitals Group     BP 10/24/21 1002 (!) 170/98     Pulse Rate 10/24/21 1002 63     Resp 10/24/21 1002 (!) 22     Temp 10/24/21 1002 97.7 F (36.5 C)     Temp Source 10/24/21 1002  Oral     SpO2 10/24/21 1002 95 %     Weight --      Height --      Head Circumference --      Peak Flow --      Pain Score 10/24/21 1000 3     Pain Loc --      Pain Edu? --      Excl. in [place]? --    No data found.  Updated Vital Signs BP (!) 170/98 (BP Location: Left Arm)   Pulse 63   Temp 97.7 F (36.5 C) (Oral)   Resp (!) 22   SpO2 95%   Visual Acuity Right Eye Distance:   Left Eye Distance:   Bilateral Distance:    Right Eye Near:   Left Eye Near:    Bilateral Near:     Physical Exam Vitals reviewed.  Constitutional:      General: He is not in acute distress.    Appearance: He is not ill-appearing, toxic-appearing or diaphoretic.  HENT:     Head: Normocephalic.     Comments: There is mild soft tissue swelling of the left posterior scalp about 3 x 5 cm.  Is mildly tender there    Right Ear: Tympanic membrane and ear canal normal.     Left Ear: Tympanic membrane and ear canal normal.     Nose: Nose normal.     Mouth/Throat:     Mouth: Mucous membranes are moist.     Pharynx: No oropharyngeal exudate or posterior oropharyngeal erythema.  Eyes:     Extraocular Movements: Extraocular movements intact.     Conjunctiva/sclera: Conjunctivae normal.     Pupils: Pupils are equal, round, and reactive to light.  Cardiovascular:     Rate and Rhythm: Normal rate and regular rhythm.     Heart sounds: No murmur heard. Pulmonary:     Effort: Pulmonary effort is normal.     Breath sounds: No stridor. No wheezing, rhonchi or rales.  Chest:     Chest wall: Tenderness (Left lower rib cage posteriorly and along the posterior axillary line on that side) present.  Abdominal:     Palpations: Abdomen is soft.     Tenderness: There is no abdominal tenderness.  Musculoskeletal:     Cervical back: Neck supple.     Comments: He has some purple bruising along the posterior medial left thigh.  Range of motion about the left knee  is normal.  Also there is some bruising around the first  MTP joint on the right with some very mild swelling.  Lymphadenopathy:     Cervical: No cervical adenopathy.  Skin:    Capillary Refill: Capillary refill takes less than 2 seconds.     Coloration: Skin is not jaundiced or pale.     Comments: There is a linear abrasion on the ulnar side of the left hand that is healing well it is about 1.5 cm in diameter.  There is also a 1 cm laceration that is healing well on the dorsal surface of the first MTP.  Secondary infection  Neurological:     General: No focal deficit present.     Mental Status: He is alert and oriented to person, place, and time.     Cranial Nerves: No cranial nerve deficit.     Sensory: No sensory deficit.     Motor: No weakness.     Coordination: Coordination normal.     Gait: Gait normal.     Deep Tendon Reflexes: Reflexes normal.  Psychiatric:        Behavior: Behavior normal.      UC Treatments / Results  Labs (all labs ordered are listed, but only abnormal results are displayed) Labs Reviewed - No data to display  EKG   Radiology DG Chest 2 View  Result Date: 10/24/2021 CLINICAL DATA:  Provided history: Cough and left lower posterior rib pain since fall 7/2. EXAM: CHEST - 2 VIEW COMPARISON:  None. FINDINGS: Heart size within normal limits. No appreciable airspace consolidation or pulmonary edema. No evidence of pleural effusion or pneumothorax. No acute bony abnormality is identified. Degenerative changes of the spine. IMPRESSION: No evidence of acute cardiopulmonary abnormality. Same day dedicated left rib radiographs separately reported. Electronically Signed   By: Jackey Loge D.O.   On: 10/24/2021 10:45   DG Ribs Unilateral W/Chest Left  Result Date: 10/24/2021 CLINICAL DATA:  Cough and pain of lower left ribs since fall on October 21, 2021. EXAM: LEFT RIBS AND CHEST - 3+ VIEW COMPARISON:  None Available. FINDINGS: No fracture or other bone lesions are seen involving the ribs. There is no evidence of pneumothorax  or pleural effusion. Both lungs are clear. Heart size and mediastinal contours are within normal limits. IMPRESSION: Negative. Electronically Signed   By: Sherian Rein M.D.   On: 10/24/2021 10:44    Procedures Procedures (including critical care time)  Medications Ordered in UC Medications  Tdap (BOOSTRIX) injection 0.5 mL (has no administration in time range)    Initial Impression / Assessment and Plan / UC Course  I have reviewed the triage vital signs and the nursing notes.  Pertinent labs & imaging results that were available during my care of the patient were reviewed by me and considered in my medical decision making (see chart for details).     X-rays of the chest and of the ribs are negative for problem.  He states that Tylenol and ibuprofen have been adequate for relief. Final Clinical Impressions(s) / UC Diagnoses   Final diagnoses:  Rib pain on left side  Abrasion  Injury of head, initial encounter     Discharge Instructions      Your x-rays did not show any fractures.  Been given a Tdap booster for your tetanus  Ice anything that hurts for the next day or so.  You can continue Tylenol or ibuprofen as needed  You may want to check your blood pressure  at the pharmacy a time or 2 in the next couple of weeks when you are feeling better     ED Prescriptions   None    PDMP not reviewed this encounter.   Barrett Henle, MD 10/24/21 1102

## 2021-10-24 NOTE — ED Triage Notes (Addendum)
Patient fell Sunday evening onto concrete.  Patient was climbing on a ladder to an above ground pool, approx 4 feet.  Patient tried to turn around on ladder to go back into pool and wet foot slipped, falling on concrete drive.   Left back, reports bruise to left inner thigh, right heel, scrape and discoloration to left left little finger .  Reports he had a knot to left side of scalp for 2 days and it is much better.    Patient says he doesn't know if he blacked out, "doesn't think so".  Patient was vomiting after event

## 2022-02-07 ENCOUNTER — Ambulatory Visit (INDEPENDENT_AMBULATORY_CARE_PROVIDER_SITE_OTHER): Payer: Commercial Managed Care - HMO | Admitting: Nurse Practitioner

## 2022-02-07 VITALS — BP 136/82 | HR 56 | Temp 96.4°F | Resp 14 | Ht 65.5 in | Wt 223.4 lb

## 2022-02-07 DIAGNOSIS — E785 Hyperlipidemia, unspecified: Secondary | ICD-10-CM | POA: Diagnosis not present

## 2022-02-07 DIAGNOSIS — E669 Obesity, unspecified: Secondary | ICD-10-CM | POA: Diagnosis not present

## 2022-02-07 DIAGNOSIS — E1169 Type 2 diabetes mellitus with other specified complication: Secondary | ICD-10-CM

## 2022-02-07 LAB — POCT GLYCOSYLATED HEMOGLOBIN (HGB A1C): Hemoglobin A1C: 7 % — AB (ref 4.0–5.6)

## 2022-02-07 MED ORDER — METFORMIN HCL 500 MG PO TABS: 500.0000 mg | ORAL_TABLET | Freq: Two times a day (BID) | ORAL | 1 refills | Status: AC

## 2022-02-07 NOTE — Assessment & Plan Note (Signed)
Currently on statin therapy.  Did have a discussion today in office that point statin therapy with diabetes is to reduce risk in regards to cardiovascular event.  Patient's last LDL was 112.  Patient has a recent weight gain of 3 pounds.  Patient declined statin therapy at this visit.  Did inform patient we will check lipids next office visit and if an increase need to consider statin therapy strongly.

## 2022-02-07 NOTE — Assessment & Plan Note (Signed)
A1c is elevated from 6-7.0.  Patient was maintaining on diet alone.  We will start patient back on metformin 500 mg twice daily.  Patient to start 1 tablet in the morning for a week if needed and then bump up to 1 tablet twice a day thereafter.  This was discussed with patient in office.  He has supplies to check his glucose at home.  He can do it once a day more if needed.  Did discuss signs and symptoms of hypoglycemia therefore patient this is a lower likelihood with the medication chosen.  Follow-up in 4 months, sooner if needed.

## 2022-02-07 NOTE — Patient Instructions (Signed)
Nice to see you today I have sent in metformin. You can check your sugar once a day, more if you need  If you stomach does not handle the metformin you can do 1 tablet in the morning for a week and then step it up to 1 tablet twice a day

## 2022-02-07 NOTE — Progress Notes (Signed)
Established Patient Office Visit  Subjective   Patient ID: Seth Underwood, male    DOB: 01-Jan-1971  Age: 51 y.o. MRN: [id number]  Chief Complaint  Patient presents with   Diabetes    Follow up   Hyperlipidemia    Follow up     Diabetes Pertinent negatives for hypoglycemia include no headaches. Pertinent negatives for diabetes include no chest pain.  Hyperlipidemia Pertinent negatives include no chest pain or shortness of breath.    DM2: States that over the past several months there has been more candy around the house. States that he has not been eating the best nor exercising.  He has gained approximately 3 pounds since last office visit.  HLD: Check lipids at last office visit LDL 112.  Patient was on statin therapy previously.  Did discuss the purpose of statin therapy and will offer some risk reduction for cardiovascular disease.  Eye: states it has been a year. States [facility] on [address]     Review of Systems  Constitutional:  Negative for chills and fever.  Respiratory:  Negative for shortness of breath.   Cardiovascular:  Negative for chest pain.  Gastrointestinal:  Negative for abdominal pain, nausea and vomiting.       BM daily   Genitourinary:  Negative for dysuria and hematuria.  Neurological:  Negative for headaches.      Objective:     BP 136/82   Pulse (!) 56   Temp (!) 96.4 F (35.8 C)   Resp 14   Ht 5' 5.5" (1.664 m)   Wt 223 lb 6 oz (101.3 kg)   SpO2 97%   BMI 36.61 kg/m  BP Readings from Last 3 Encounters:  02/07/22 136/82  10/24/21 (!) 170/98  10/09/21 130/82   Wt Readings from Last 3 Encounters:  02/07/22 223 lb 6 oz (101.3 kg)  10/09/21 220 lb 9 oz (100 kg)  01/30/21 201 lb (91.2 kg)      Physical Exam Vitals and nursing note reviewed.  Constitutional:      Appearance: Normal appearance.  Neck:     Vascular: No carotid bruit.  Cardiovascular:     Rate and Rhythm: Normal rate and regular rhythm.     Heart sounds:  Normal heart sounds.  Pulmonary:     Effort: Pulmonary effort is normal.     Breath sounds: Normal breath sounds.  Abdominal:     General: Bowel sounds are normal.  Musculoskeletal:     Right lower leg: No edema.     Left lower leg: No edema.       Feet:  Feet:     Right foot:     Skin integrity: Dry skin present.     Toenail Condition: Right toenails are normal.     Left foot:     Skin integrity: Dry skin present.     Toenail Condition: Left toenails are normal.  Neurological:     Mental Status: He is alert.      Results for orders placed or performed in visit on 02/07/22  POCT glycosylated hemoglobin (Hb A1C)  Result Value Ref Range   Hemoglobin A1C 7.0 (A) 4.0 - 5.6 %   HbA1c POC (<> result, manual entry)     HbA1c, POC (prediabetic range)     HbA1c, POC (controlled diabetic range)        The 10-year ASCVD risk score (Arnett DK, et al., 2019) is: 7.2%    Assessment & Plan:   Problem List  Items Addressed This Visit       Endocrine   Diabetes mellitus type 2 in obese (HCC) - Primary    A1c is elevated from 6-7.0.  Patient was maintaining on diet alone.  We will start patient back on metformin 500 mg twice daily.  Patient to start 1 tablet in the morning for a week if needed and then bump up to 1 tablet twice a day thereafter.  This was discussed with patient in office.  He has supplies to check his glucose at home.  He can do it once a day more if needed.  Did discuss signs and symptoms of hypoglycemia therefore patient this is a lower likelihood with the medication chosen.  Follow-up in 4 months, sooner if needed.      Relevant Medications   metFORMIN (GLUCOPHAGE) 500 MG tablet   Other Relevant Orders   POCT glycosylated hemoglobin (Hb A1C) (Completed)   Hyperlipidemia associated with type 2 diabetes mellitus ([place])    Currently on statin therapy.  Did have a discussion today in office that point statin therapy with diabetes is to reduce risk in regards to  cardiovascular event.  Patient's last LDL was 112.  Patient has a recent weight gain of 3 pounds.  Patient declined statin therapy at this visit.  Did inform patient we will check lipids next office visit and if an increase need to consider statin therapy strongly.      Relevant Medications   metFORMIN (GLUCOPHAGE) 500 MG tablet    Return in about 4 months (around 06/10/2022) for DM recheck .    Romilda Garret, NP

## 2022-06-10 ENCOUNTER — Encounter: Payer: Self-pay | Admitting: Nurse Practitioner

## 2022-06-10 ENCOUNTER — Ambulatory Visit (INDEPENDENT_AMBULATORY_CARE_PROVIDER_SITE_OTHER): Payer: Self-pay | Admitting: Nurse Practitioner

## 2022-06-10 VITALS — BP 138/78 | HR 79 | Temp 98.0°F | Resp 16 | Ht 65.5 in | Wt 227.0 lb

## 2022-06-10 DIAGNOSIS — E1169 Type 2 diabetes mellitus with other specified complication: Secondary | ICD-10-CM

## 2022-06-10 DIAGNOSIS — E669 Obesity, unspecified: Secondary | ICD-10-CM

## 2022-06-10 LAB — COMPREHENSIVE METABOLIC PANEL
ALT: 31 U/L (ref 0–53)
AST: 21 U/L (ref 0–37)
Albumin: 4.1 g/dL (ref 3.5–5.2)
Alkaline Phosphatase: 68 U/L (ref 39–117)
BUN: 20 mg/dL (ref 6–23)
CO2: 27 mEq/L (ref 19–32)
Calcium: 9.5 mg/dL (ref 8.4–10.5)
Chloride: 106 mEq/L (ref 96–112)
Creatinine, Ser: 0.93 mg/dL (ref 0.40–1.50)
GFR: 94.63 mL/min (ref >60.00)
Glucose, Bld: 131 mg/dL — ABNORMAL HIGH (ref 70–99)
Potassium: 4.3 mEq/L (ref 3.5–5.1)
Sodium: 139 mEq/L (ref 135–145)
Total Bilirubin: 0.4 mg/dL (ref 0.2–1.2)
Total Protein: 6.9 g/dL (ref 6.0–8.3)

## 2022-06-10 LAB — POCT GLYCOSYLATED HEMOGLOBIN (HGB A1C): Hemoglobin A1C: 6.9 % — AB (ref 4.0–5.6)

## 2022-06-10 LAB — CBC
HCT: 46.8 % (ref 39.0–52.0)
Hemoglobin: 15.9 g/dL (ref 13.0–17.0)
MCHC: 34 g/dL (ref 30.0–36.0)
MCV: 91.7 fl (ref 78.0–100.0)
Platelets: 251 10*3/uL (ref 150.0–400.0)
RBC: 5.11 Mil/uL (ref 4.22–5.81)
RDW: 13.9 % (ref 11.5–15.5)
WBC: 6.2 10*3/uL (ref 4.0–10.5)

## 2022-06-10 MED ORDER — BLOOD GLUC METER DISP-STRIPS DEVI: 1.0000 | 0 refills | Status: AC

## 2022-06-10 NOTE — Assessment & Plan Note (Signed)
Patient has not been checking blood sugars at home and states he does not have a meter.  Prescription for meter sent in.  Patient to start taking metformin 500 mg twice daily.  States he is only taking a daily.  Check CMP since starting on metformin

## 2022-06-10 NOTE — Patient Instructions (Signed)
Nice to see you today Start taking 1 tablet (589m) in the morning and one in the evening Follow up with me in 4 months, sooner if you need me I will be in touch with the labs once I have them

## 2022-06-10 NOTE — Progress Notes (Signed)
   Established Patient Office Visit  Subjective   Patient ID: Seth Underwood, male    DOB: 06-16-1970  Age: 52 y.o. MRN: [id number]  Chief Complaint  Patient presents with   Diabetes      DM2: patient currently on metformin 550m BID Last ac was 7.0 then started on metformin. States that he is doing just one metformin a day.  Checking sugars at home: not checking  High glucose: Has not been checking Low glucose: Has not been checking but no symptoms of hypoglycemia per patient report Diet : states that it has not been the best States coffee int he morning a sugar free drink and water      Review of Systems  Constitutional:  Negative for chills and fever.  Respiratory:  Negative for shortness of breath.   Cardiovascular:  Negative for chest pain.  Gastrointestinal:  Negative for abdominal pain, nausea and vomiting.  Neurological:  Negative for tingling and headaches.      Objective:     BP 138/78   Pulse 79   Temp 98 F (36.7 C)   Resp 16   Ht 5' 5.5" (1.664 m)   Wt 227 lb (103 kg)   SpO2 98%   BMI 37.20 kg/m  BP Readings from Last 3 Encounters:  06/10/22 138/78  02/07/22 136/82  10/24/21 (!) 170/98   Wt Readings from Last 3 Encounters:  06/10/22 227 lb (103 kg)  02/07/22 223 lb 6 oz (101.3 kg)  10/09/21 220 lb 9 oz (100 kg)      Physical Exam Vitals and nursing note reviewed.  Constitutional:      Appearance: Normal appearance.  Cardiovascular:     Rate and Rhythm: Normal rate and regular rhythm.     Heart sounds: Normal heart sounds.  Pulmonary:     Effort: Pulmonary effort is normal.     Breath sounds: Normal breath sounds.  Musculoskeletal:     Right lower leg: No edema.     Left lower leg: No edema.       Feet:  Skin:    General: Skin is warm.  Neurological:     Mental Status: He is alert.      Results for orders placed or performed in visit on 06/10/22  POCT glycosylated hemoglobin (Hb A1C)  Result Value Ref Range    Hemoglobin A1C 6.9 (A) 4.0 - 5.6 %   HbA1c POC (<> result, manual entry)     HbA1c, POC (prediabetic range)     HbA1c, POC (controlled diabetic range)        The 10-year ASCVD risk score (Arnett DK, et al., 2019) is: 8.1%    Assessment & Plan:   Problem List Items Addressed This Visit       Endocrine   Diabetes mellitus type 2 in obese ([facility] - Primary    Patient has not been checking blood sugars at home and states he does not have a meter.  Prescription for meter sent in.  Patient to start taking metformin 500 mg twice daily.  States he is only taking a daily.  Check CMP since starting on metformin      Relevant Medications   Blood Gluc Meter Disp-Strips (BLOOD GLUCOSE METER DISPOSABLE) DEVI   Other Relevant Orders   POCT glycosylated hemoglobin (Hb A1C) (Completed)   Comprehensive metabolic panel   CBC    Return in about 4 months (around 10/09/2022) for DM recheck.    MRomilda Garret NP

## 2022-08-08 ENCOUNTER — Telehealth: Payer: Self-pay

## 2022-08-08 NOTE — Telephone Encounter (Signed)
Sent a fax requesting diabetic eye exam from [facility]

## 2023-03-06 ENCOUNTER — Encounter: Payer: Self-pay | Admitting: Nurse Practitioner

## 2023-03-06 NOTE — Telephone Encounter (Signed)
Care team updated and letter sent for eye exam notes.
